# Patient Record
Sex: Female | Born: 1970 | Race: White | Hispanic: No | Marital: Married | Smoking: Former smoker
Health system: Southern US, Community
[De-identification: ages and names within clinical notes are randomized; demographics above are authoritative.]

## PROBLEM LIST (undated history)

## (undated) DIAGNOSIS — F419 Anxiety disorder, unspecified: Secondary | ICD-10-CM

## (undated) DIAGNOSIS — R945 Abnormal results of liver function studies: Secondary | ICD-10-CM

## (undated) DIAGNOSIS — D649 Anemia, unspecified: Secondary | ICD-10-CM

## (undated) DIAGNOSIS — R7989 Other specified abnormal findings of blood chemistry: Secondary | ICD-10-CM

## (undated) HISTORY — PX: ERCP: SHX60

## (undated) HISTORY — DX: Other specified abnormal findings of blood chemistry: R79.89

## (undated) HISTORY — PX: OVARIAN CYST REMOVAL: SHX89

## (undated) HISTORY — PX: ECTOPIC PREGNANCY SURGERY: SHX613

## (undated) HISTORY — PX: CHOLECYSTECTOMY: SHX55

## (undated) HISTORY — DX: Anemia, unspecified: D64.9

## (undated) HISTORY — DX: Abnormal results of liver function studies: R94.5

---

## 1999-12-07 ENCOUNTER — Other Ambulatory Visit: Admission: RE | Admit: 1999-12-07 | Discharge: 1999-12-07 | Payer: Self-pay | Admitting: *Deleted

## 2000-05-02 ENCOUNTER — Inpatient Hospital Stay (HOSPITAL_COMMUNITY): Admission: EM | Admit: 2000-05-02 | Discharge: 2000-05-04 | Payer: Self-pay | Admitting: Emergency Medicine

## 2000-05-02 ENCOUNTER — Encounter (INDEPENDENT_AMBULATORY_CARE_PROVIDER_SITE_OTHER): Payer: Self-pay | Admitting: Specialist

## 2000-05-02 ENCOUNTER — Encounter: Payer: Self-pay | Admitting: Emergency Medicine

## 2002-04-11 ENCOUNTER — Other Ambulatory Visit: Admission: RE | Admit: 2002-04-11 | Discharge: 2002-04-11 | Payer: Self-pay | Admitting: Family Medicine

## 2002-04-15 ENCOUNTER — Encounter: Admission: RE | Admit: 2002-04-15 | Discharge: 2002-04-15 | Payer: Self-pay | Admitting: Family Medicine

## 2002-04-15 ENCOUNTER — Encounter: Payer: Self-pay | Admitting: Family Medicine

## 2004-04-25 ENCOUNTER — Ambulatory Visit: Payer: Self-pay | Admitting: Internal Medicine

## 2004-08-15 ENCOUNTER — Ambulatory Visit: Payer: Self-pay | Admitting: Internal Medicine

## 2004-09-09 ENCOUNTER — Ambulatory Visit: Payer: Self-pay | Admitting: Internal Medicine

## 2004-11-25 ENCOUNTER — Ambulatory Visit: Payer: Self-pay | Admitting: Internal Medicine

## 2004-12-13 ENCOUNTER — Other Ambulatory Visit: Admission: RE | Admit: 2004-12-13 | Discharge: 2004-12-13 | Payer: Self-pay | Admitting: Internal Medicine

## 2004-12-13 ENCOUNTER — Ambulatory Visit: Payer: Self-pay | Admitting: Internal Medicine

## 2005-01-03 ENCOUNTER — Encounter: Admission: RE | Admit: 2005-01-03 | Discharge: 2005-01-03 | Payer: Self-pay | Admitting: Internal Medicine

## 2005-12-19 ENCOUNTER — Ambulatory Visit: Payer: Self-pay | Admitting: Internal Medicine

## 2006-01-02 ENCOUNTER — Ambulatory Visit: Payer: Self-pay | Admitting: Internal Medicine

## 2006-01-08 ENCOUNTER — Ambulatory Visit: Payer: Self-pay | Admitting: Internal Medicine

## 2006-01-08 LAB — CONVERTED CEMR LAB
BUN: 8 mg/dL
CO2: 24 meq/L
Calcium: 9.4 mg/dL
Chloride: 109 meq/L
Creatinine, Ser: 0.8 mg/dL
GFR calc non Af Amer: 86 mL/min
Glomerular Filtration Rate, Af Am: 104 mL/min/1.73m2
Glucose, Bld: 92 mg/dL
HCT: 36.8 %
Hemoglobin: 12.4 g/dL
MCHC: 33.8 g/dL
MCV: 85.7 fL
Platelets: 297 K/uL
Potassium: 3.8 meq/L
RBC: 4.29 M/uL
RDW: 13.3 %
Sed Rate: 11 mm/h
Sodium: 140 meq/L
TSH: 4.46 u[IU]/mL
Total CK: 81 U/L
WBC: 6.9 10*3/microliter

## 2006-01-16 ENCOUNTER — Ambulatory Visit: Payer: Self-pay | Admitting: Cardiovascular Disease

## 2006-08-03 DIAGNOSIS — F419 Anxiety disorder, unspecified: Secondary | ICD-10-CM

## 2006-08-10 ENCOUNTER — Ambulatory Visit: Payer: Self-pay | Admitting: Internal Medicine

## 2006-08-13 LAB — CONVERTED CEMR LAB
ALT: 8 U/L
AST: 17 U/L
Albumin: 3.8 g/dL
Alkaline Phosphatase: 35 U/L — ABNORMAL LOW
Basophils Absolute: 0 10*3/uL
Basophils Relative: 0.5 %
Bilirubin, Direct: 0.2 mg/dL
Eosinophils Absolute: 0.1 10*3/uL
Eosinophils Relative: 0.8 %
HCT: 35.8 % — ABNORMAL LOW
Hemoglobin: 12.3 g/dL
INR: 1
Lymphocytes Relative: 33.6 %
MCHC: 34.2 g/dL
MCV: 88.5 fL
Monocytes Absolute: 0.6 10*3/uL
Monocytes Relative: 8.1 %
Neutro Abs: 4.3 10*3/uL
Neutrophils Relative %: 57 %
Platelets: 237 10*3/uL
Prothrombin Time: 12.3 s
RBC: 4.05 M/uL
RDW: 12.5 %
Total Bilirubin: 1.9 mg/dL — ABNORMAL HIGH
Total Protein: 6.8 g/dL
WBC: 7.6 10*3/uL
aPTT: 24.4 s — ABNORMAL LOW

## 2006-08-15 ENCOUNTER — Telehealth (INDEPENDENT_AMBULATORY_CARE_PROVIDER_SITE_OTHER): Payer: Self-pay | Admitting: *Deleted

## 2006-12-17 ENCOUNTER — Telehealth (INDEPENDENT_AMBULATORY_CARE_PROVIDER_SITE_OTHER): Payer: Self-pay | Admitting: *Deleted

## 2006-12-19 ENCOUNTER — Ambulatory Visit: Payer: Self-pay | Admitting: Internal Medicine

## 2007-01-21 ENCOUNTER — Telehealth (INDEPENDENT_AMBULATORY_CARE_PROVIDER_SITE_OTHER): Payer: Self-pay | Admitting: *Deleted

## 2007-01-28 ENCOUNTER — Telehealth (INDEPENDENT_AMBULATORY_CARE_PROVIDER_SITE_OTHER): Payer: Self-pay | Admitting: *Deleted

## 2007-01-28 ENCOUNTER — Ambulatory Visit: Payer: Self-pay | Admitting: Internal Medicine

## 2007-01-28 LAB — CONVERTED CEMR LAB
Glucose, Urine, Semiquant: NEGATIVE
Ketones, urine, test strip: NEGATIVE
Nitrite: NEGATIVE

## 2007-01-29 ENCOUNTER — Ambulatory Visit: Payer: Self-pay | Admitting: Cardiology

## 2007-01-29 ENCOUNTER — Ambulatory Visit: Payer: Self-pay | Admitting: Internal Medicine

## 2007-01-29 ENCOUNTER — Encounter: Payer: Self-pay | Admitting: Family Medicine

## 2007-01-29 LAB — CONVERTED CEMR LAB: Chlamydia, DNA Probe: NEGATIVE

## 2007-01-30 ENCOUNTER — Telehealth (INDEPENDENT_AMBULATORY_CARE_PROVIDER_SITE_OTHER): Payer: Self-pay | Admitting: *Deleted

## 2007-01-30 ENCOUNTER — Encounter: Payer: Self-pay | Admitting: Internal Medicine

## 2007-01-30 LAB — CONVERTED CEMR LAB
BUN: 8 mg/dL (ref 6–23)
Basophils Absolute: 0 10*3/uL (ref 0.0–0.1)
Basophils Relative: 0.5 % (ref 0.0–1.0)
Creatinine, Ser: 0.8 mg/dL (ref 0.4–1.2)
Eosinophils Relative: 0.9 % (ref 0.0–5.0)
GFR calc non Af Amer: 86 mL/min
Glucose, Bld: 93 mg/dL (ref 70–99)
Hemoglobin: 11.9 g/dL — ABNORMAL LOW (ref 12.0–15.0)
Lymphocytes Relative: 34.5 % (ref 12.0–46.0)
MCHC: 34.6 g/dL (ref 30.0–36.0)
Neutro Abs: 4 10*3/uL (ref 1.4–7.7)
Neutrophils Relative %: 56 % (ref 43.0–77.0)
Platelets: 221 10*3/uL (ref 150–400)
Potassium: 3.9 meq/L (ref 3.5–5.1)
Sodium: 138 meq/L (ref 135–145)
WBC: 7.1 10*3/uL (ref 4.5–10.5)

## 2007-02-05 ENCOUNTER — Telehealth: Payer: Self-pay | Admitting: Internal Medicine

## 2007-02-08 ENCOUNTER — Telehealth: Payer: Self-pay | Admitting: Internal Medicine

## 2007-02-27 ENCOUNTER — Ambulatory Visit: Payer: Self-pay | Admitting: Internal Medicine

## 2007-03-01 ENCOUNTER — Telehealth (INDEPENDENT_AMBULATORY_CARE_PROVIDER_SITE_OTHER): Payer: Self-pay | Admitting: *Deleted

## 2007-03-05 ENCOUNTER — Telehealth (INDEPENDENT_AMBULATORY_CARE_PROVIDER_SITE_OTHER): Payer: Self-pay | Admitting: *Deleted

## 2007-03-06 ENCOUNTER — Ambulatory Visit: Payer: Self-pay | Admitting: Internal Medicine

## 2007-03-07 ENCOUNTER — Telehealth (INDEPENDENT_AMBULATORY_CARE_PROVIDER_SITE_OTHER): Payer: Self-pay | Admitting: *Deleted

## 2007-03-08 ENCOUNTER — Encounter: Payer: Self-pay | Admitting: Internal Medicine

## 2007-03-08 ENCOUNTER — Telehealth (INDEPENDENT_AMBULATORY_CARE_PROVIDER_SITE_OTHER): Payer: Self-pay | Admitting: *Deleted

## 2007-03-14 ENCOUNTER — Encounter: Payer: Self-pay | Admitting: Internal Medicine

## 2007-03-19 ENCOUNTER — Encounter (INDEPENDENT_AMBULATORY_CARE_PROVIDER_SITE_OTHER): Payer: Self-pay | Admitting: *Deleted

## 2007-03-25 ENCOUNTER — Ambulatory Visit: Payer: Self-pay | Admitting: Gastroenterology

## 2007-09-09 ENCOUNTER — Telehealth (INDEPENDENT_AMBULATORY_CARE_PROVIDER_SITE_OTHER): Payer: Self-pay | Admitting: *Deleted

## 2008-01-27 ENCOUNTER — Telehealth (INDEPENDENT_AMBULATORY_CARE_PROVIDER_SITE_OTHER): Payer: Self-pay | Admitting: *Deleted

## 2008-03-13 ENCOUNTER — Ambulatory Visit: Payer: Self-pay | Admitting: Family Medicine

## 2008-11-10 ENCOUNTER — Telehealth (INDEPENDENT_AMBULATORY_CARE_PROVIDER_SITE_OTHER): Payer: Self-pay | Admitting: *Deleted

## 2009-03-04 ENCOUNTER — Encounter: Payer: Self-pay | Admitting: Internal Medicine

## 2009-03-06 ENCOUNTER — Encounter: Payer: Self-pay | Admitting: Internal Medicine

## 2009-03-08 ENCOUNTER — Encounter: Payer: Self-pay | Admitting: Internal Medicine

## 2009-03-10 ENCOUNTER — Encounter: Payer: Self-pay | Admitting: Internal Medicine

## 2009-03-12 ENCOUNTER — Telehealth (INDEPENDENT_AMBULATORY_CARE_PROVIDER_SITE_OTHER): Payer: Self-pay | Admitting: *Deleted

## 2009-03-15 ENCOUNTER — Ambulatory Visit: Payer: Self-pay | Admitting: Internal Medicine

## 2009-03-15 ENCOUNTER — Ambulatory Visit (HOSPITAL_BASED_OUTPATIENT_CLINIC_OR_DEPARTMENT_OTHER)
Admission: RE | Admit: 2009-03-15 | Discharge: 2009-03-15 | Payer: Self-pay | Source: Home / Self Care | Admitting: Internal Medicine

## 2009-03-15 ENCOUNTER — Ambulatory Visit: Payer: Self-pay | Admitting: Diagnostic Radiology

## 2009-03-15 DIAGNOSIS — K219 Gastro-esophageal reflux disease without esophagitis: Secondary | ICD-10-CM

## 2009-03-15 DIAGNOSIS — D509 Iron deficiency anemia, unspecified: Secondary | ICD-10-CM | POA: Insufficient documentation

## 2009-03-16 DIAGNOSIS — R911 Solitary pulmonary nodule: Secondary | ICD-10-CM

## 2009-03-18 ENCOUNTER — Encounter: Payer: Self-pay | Admitting: Internal Medicine

## 2009-03-19 ENCOUNTER — Encounter: Payer: Self-pay | Admitting: Internal Medicine

## 2009-03-20 ENCOUNTER — Encounter: Payer: Self-pay | Admitting: Internal Medicine

## 2009-03-22 ENCOUNTER — Encounter: Payer: Self-pay | Admitting: Internal Medicine

## 2009-03-23 ENCOUNTER — Encounter: Payer: Self-pay | Admitting: Internal Medicine

## 2009-03-24 ENCOUNTER — Encounter: Payer: Self-pay | Admitting: Internal Medicine

## 2009-04-06 ENCOUNTER — Encounter: Payer: Self-pay | Admitting: Internal Medicine

## 2009-04-07 ENCOUNTER — Telehealth (INDEPENDENT_AMBULATORY_CARE_PROVIDER_SITE_OTHER): Payer: Self-pay | Admitting: *Deleted

## 2009-04-14 ENCOUNTER — Telehealth: Payer: Self-pay | Admitting: Internal Medicine

## 2009-04-19 ENCOUNTER — Ambulatory Visit: Payer: Self-pay | Admitting: Internal Medicine

## 2009-04-19 DIAGNOSIS — Z8639 Personal history of other endocrine, nutritional and metabolic disease: Secondary | ICD-10-CM

## 2009-04-19 DIAGNOSIS — Z862 Personal history of diseases of the blood and blood-forming organs and certain disorders involving the immune mechanism: Secondary | ICD-10-CM

## 2009-05-04 ENCOUNTER — Telehealth (INDEPENDENT_AMBULATORY_CARE_PROVIDER_SITE_OTHER): Payer: Self-pay | Admitting: *Deleted

## 2009-05-06 ENCOUNTER — Telehealth (INDEPENDENT_AMBULATORY_CARE_PROVIDER_SITE_OTHER): Payer: Self-pay | Admitting: *Deleted

## 2009-05-10 LAB — CONVERTED CEMR LAB
AST: 26 units/L (ref 0–37)
Alkaline Phosphatase: 42 units/L (ref 39–117)
Basophils Relative: 2.1 % (ref 0.0–3.0)
Bilirubin, Direct: 0.2 mg/dL (ref 0.0–0.3)
Eosinophils Relative: 1.5 % (ref 0.0–5.0)
Folate: 14.7 ng/mL
HCT: 31.9 % — ABNORMAL LOW (ref 36.0–46.0)
Iron: 52 ug/dL (ref 42–145)
Lymphs Abs: 3.3 10*3/uL (ref 0.7–4.0)
MCHC: 31.6 g/dL (ref 30.0–36.0)
MCV: 83.9 fL (ref 78.0–100.0)
Neutro Abs: 2.3 10*3/uL (ref 1.4–7.7)
Neutrophils Relative %: 33.3 % — ABNORMAL LOW (ref 43.0–77.0)
Total Protein: 7.1 g/dL (ref 6.0–8.3)
Vitamin B-12: 474 pg/mL (ref 211–911)
WBC: 7 10*3/uL (ref 4.5–10.5)

## 2009-06-08 ENCOUNTER — Encounter (INDEPENDENT_AMBULATORY_CARE_PROVIDER_SITE_OTHER): Payer: Self-pay | Admitting: *Deleted

## 2009-07-09 ENCOUNTER — Telehealth (INDEPENDENT_AMBULATORY_CARE_PROVIDER_SITE_OTHER): Payer: Self-pay | Admitting: *Deleted

## 2009-08-09 ENCOUNTER — Ambulatory Visit: Payer: Self-pay | Admitting: Internal Medicine

## 2010-02-25 ENCOUNTER — Ambulatory Visit
Admission: RE | Admit: 2010-02-25 | Discharge: 2010-02-25 | Payer: Self-pay | Source: Home / Self Care | Attending: Internal Medicine | Admitting: Internal Medicine

## 2010-03-07 ENCOUNTER — Ambulatory Visit
Admission: RE | Admit: 2010-03-07 | Discharge: 2010-03-07 | Payer: Self-pay | Source: Home / Self Care | Attending: Internal Medicine | Admitting: Internal Medicine

## 2010-03-14 ENCOUNTER — Ambulatory Visit (HOSPITAL_COMMUNITY)
Admission: RE | Admit: 2010-03-14 | Discharge: 2010-03-14 | Payer: Self-pay | Source: Home / Self Care | Attending: Internal Medicine | Admitting: Internal Medicine

## 2010-03-25 ENCOUNTER — Ambulatory Visit (HOSPITAL_COMMUNITY)
Admission: RE | Admit: 2010-03-25 | Discharge: 2010-03-25 | Payer: Self-pay | Source: Home / Self Care | Attending: Internal Medicine | Admitting: Internal Medicine

## 2010-03-27 LAB — CONVERTED CEMR LAB
ALT: 11 U/L
AST: 22 U/L
AST: 293 units/L
Albumin: 4 g/dL
Alkaline Phosphatase: 39 U/L
Alkaline Phosphatase: 89 units/L
B-12, serum: 504 pg/mL
B-12, serum: 504 pg/mL
Brain Natriuretic Peptide: 13.8
Ferritin: 6 ng/mL
Folate: 18.4 ng/mL
Hemoglobin: 10.5 g/dL
Iron: 14 ug/dL
Total Bilirubin: 0.7 mg/dL
Total Bilirubin: 2.2 mg/dL
Total Protein: 6.1 g/dL
Total Protein: 6.8 g/dL
Vit D, 25-Hydroxy: 19 ng/mL
WBC, blood: 6.1 10*3/mm3
platelet count: 271 10*3/microliter

## 2010-03-31 NOTE — Miscellaneous (Signed)
Summary: labs from [facility] laboratory  Clinical Lists Changes  Observations: Added new observation of PROTEIN, TOT: 6.1 g/dL (72/53/6644 0:34) Added new observation of ALBUMIN: 3.5 g/dL (74/25/9563 8:75) Added new observation of BILI TOTAL: 2.2 mg/dL (64/33/2951 8:84) Added new observation of ALK PHOS: 89 units/L (03/20/2009 8:25) Added new observation of SGPT (ALT): 213 units/L (03/20/2009 8:25) Added new observation of SGOT (AST): 293 units/L (03/20/2009 8:25)        Chemistry Labs Test Date: 03/20/2009                      Value Units        H/L   Reference  SGOT:               293   U/L           H    (10-40) SGPT:               213   U/L           H    (10-40) Alkaline P'tase:    89    U/L                (10-120) T. Bili:            2.2   mg/dL         H    (1.6-6.0) Albumin:            3.5   g/dL               (3-5) Total Protein:      6.1   g/dL               (4-7)  Lab name:           [facility] Test ordered by:    Lionel December, md

## 2010-03-31 NOTE — Progress Notes (Signed)
Summary: sick  Phone Note Call from Patient Call back at [facility] Phone [phone]   Summary of Call:  - cough, fever, achy, requesting abx.  Advised pt needs ov.  Patient states she will go to urgent care Donna Lucas  April 07, 2009 10:43 AM

## 2010-03-31 NOTE — Progress Notes (Signed)
  Phone Note Call from Patient Call back at [facility] Phone [phone]   Summary of Call: Patient is still having back pain, stomach pain.  She is states that she feels "chilly" all the time & tired.  She is requesting labs to check iron levels  & anything else that you might think she needs.  She would like to come in next monday for labs & then schedule a follow up visit after that.  Suggestions??  labs??   Initial call taken by: Shary Decamp,  April 14, 2009 11:03 AM  Follow-up for Phone Call         OV if so desire,  labs at time of the visit.  Follow-up by: Nolon Rod. Amatullah Christy MD,  April 14, 2009 11:25 AM  Additional Follow-up for Phone Call Additional follow up Details #1::        scheduled ov Additional Follow-up by: Shary Decamp,  April 14, 2009 11:29 AM

## 2010-03-31 NOTE — Miscellaneous (Signed)
Summary: labs from urgent care   original scanned into emr  D-dimer 0.41 ug/ml Troponin 1 <0.01 ng/ml  Donna Lucas  March 17, 2009 8:18 AM  Clinical Lists Changes  Observations: Added new observation of VIT D 25-OH: 19 ng/mL (03/10/2009 8:10) Added new observation of IRON: 14 mcg/dL (04/54/0981 1:91) Added new observation of BNP: 13.8  (03/06/2009 10:11) Added new observation of FERRITIN: 6 ng/mL (03/06/2009 8:17) Added new observation of FOLATE: 18.4 ng/mL (03/06/2009 8:17) Added new observation of B-12: 504 pg/mL (03/06/2009 8:17) Added new observation of B-12: 504 pg/mL (03/06/2009 8:17) Added new observation of PLATELET CNT: 271 10*3/microliter (03/04/2009 8:12) Added new observation of HGB: 10.5 g/dL (47/82/9562 1:30) Added new observation of WBC: 6.1 10*3/mm3 (03/04/2009 8:12)     Iron, serum:          14           ug/dl         L    (86-578)                       4            %                     Vit D. 25-OH:         19           ng/ml              (16-74)  Test ordered by:      [facility]na. [facility] urgent care    Complete Blood Count Test Date: 03/04/2009             Value   Units      H/L    Reference  WBC:       6.1   X 10^3/uL          (3.5-10.0) Hgb:       10.5  g/dl          L    (46.9-62.3) Platelets: 271   X 10^3/uL          (150-450)  Test performed by:  [facility]    Lab Entry Test Date: 01/08/201101/12/201101/01/2010                        Value        Units        H/L   Reference  B-12 level:           504          pg/ml              [phone]) Ferritin:             6            ng/ml         L    (22-322) Folate (Ser):         18.4         ng/ml              (2.7-20)      Lipid Panel Test Date: 03/06/2009                        Value        Units        H/L   Reference  BNP  13.8                                

## 2010-03-31 NOTE — Letter (Signed)
Summary: [facility]  [facility]   Imported By: Lanelle Bal 03/19/2009 09:51:41  _____________________________________________________________________  External Attachment:    Type:   Image     Comment:   External Document

## 2010-03-31 NOTE — Consult Note (Signed)
Summary: Marylene Buerger MD  Cherie Ouch MD   Imported By: Lanelle Bal 04/21/2009 10:56:14  _____________________________________________________________________  External Attachment:    Type:   Image     Comment:   External Document

## 2010-03-31 NOTE — Assessment & Plan Note (Signed)
Summary: followup/alr   Vital Signs:  Patient profile:   40 year old female Height:      64 inches Weight:      138 pounds Temp:     98.8 degrees F oral Pulse rate:   80 / minute BP sitting:   90 / 78  (left arm)  Vitals Entered By: Jeremy Johann CMA (August 09, 2009 2:56 PM) CC: follow-up med Comments --refill REVIEWED MED LIST, PATIENT AGREED DOSE AND INSTRUCTION CORRECT    History of Present Illness: here for followup She had severe anxiety which was chronic, at the time of the last visit we increased her citalopram dose She has also talked with the school psychologist who gave her good advice She has quit her second job, enjoying a less stressful life Doing great   Allergies (verified): No Known Drug Allergies  Past History:  Past Medical History: Reviewed history from 04/19/2009 and no changes required. BC--condoms Anxiety elevated LFTs after a cholecystectomy 1-11, status post ERCP essentially okay diagnosed with anemia 1-11, saw  GI abnormal CT of the chest 1-11, solitary nodule, follow-up by pulmonology, neck CT, April 2011  Past Surgical History: Reviewed history from 03/15/2009 and no changes required. 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 All symptoms resolve after anxiety was taken care of.   Physical Exam  General:  alert and well-developed.   Lungs:  normal respiratory effort, no intercostal retractions, no accessory muscle use, and normal breath sounds.   Heart:  normal rate, regular rhythm, no murmur, and no gallop.   Extremities:  no edema Psych:  Oriented X3, memory intact for recent and remote, normally  interactive, good eye contact, not anxious appearing, and not depressed appearing.     Impression & Recommendations:  Problem # 1:  ANXIETY (ICD-300.00) doing great with Celexa She has some side effects (gained 4 pounds, slightly sleepy) but we agreed to continue with this medicine for now since the side effects are  not severe. Reassess in 6 months Anticipate she will need citalopram for a while since she has chronic anxiety The following medications were removed from the medication list:    Alprazolam 0.5 Mg Tabs (Alprazolam) .Marland Kitchen... 1 by mouth three times a day Her updated medication list for this problem includes:    Celexa 40 Mg Tabs (Citalopram hydrobromide) .Marland Kitchen... 1 by mouth once daily - needs office visit for additional refills  Complete Medication List: 1)  Celexa 40 Mg Tabs (Citalopram hydrobromide) .Marland Kitchen.. 1 by mouth once daily - needs office visit for additional refills  Patient Instructions: 1)  Please schedule a follow-up appointment in 6 months .  Prescriptions: CELEXA 40 MG  TABS (CITALOPRAM HYDROBROMIDE) 1 by mouth once daily - NEEDS OFFICE VISIT FOR ADDITIONAL REFILLS  #90 x 2   Entered and Authorized by:   Nolon Rod. Gentry Seeber MD   Signed by:   Nolon Rod. Tahj Lindseth MD on 08/09/2009   Method used:   Electronically to        [facility]* (retail)       [address]       [place], [place]  [address]       Ph: [phone]  Fax: [phone]   RxID:   [id number]

## 2010-03-31 NOTE — Miscellaneous (Signed)
Summary: Orders Update  Clinical Lists Changes  Orders: Added new Referral order of Pulmonary Referral (Pulmonary) - Signed 

## 2010-03-31 NOTE — Progress Notes (Signed)
Summary: Refill Request Alprazolam  Phone Note Refill Request Message from:  [facility] [address] Fax #: [phone]  Refills Requested: Medication #1:  ALPRAZOLAM 0.5 MG TABS 1 by mouth three times a day   Dosage confirmed as above?Dosage Confirmed   Supply Requested: 1 month   Last Refilled: 03/19/2009 last refill #60 x 0 on 03/19/09, last ov 04/19/09  Next Appointment Scheduled: 3.15.11 Initial call taken by: Harold Barban,  May 06, 2009 9:32 AM  Follow-up for Phone Call        ok 60, no RF Follow-up by: Nolon Rod. Paz MD,  May 06, 2009 12:59 PM    Prescriptions: ALPRAZOLAM 0.5 MG TABS (ALPRAZOLAM) 1 by mouth three times a day  #60 x 0   Entered by:   Kandice Hams   Authorized by:   Nolon Rod. Paz MD   Signed by:   Kandice Hams on 05/06/2009   Method used:   Printed then faxed [facility] ...       [facility]  [address] [phone] (retail)       [address]       [place], [place]  [address]       Ph: [phone]       Fax: [phone]   RxID:   [id number]

## 2010-03-31 NOTE — Progress Notes (Signed)
  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 Office visit scheduled for June 6  rx flled for 30 days .Kandice Hams  Jul 09, 2009 9:05 AM  Initial call taken by: Kandice Hams,  Jul 09, 2009 9:05 AM    Prescriptions: CELEXA 40 MG  TABS (CITALOPRAM HYDROBROMIDE) 1 by mouth once daily - NEEDS OFFICE VISIT FOR ADDITIONAL REFILLS  #30 x 0   Entered by:   Kandice Hams   Authorized by:   Nolon Rod. Paz MD   Signed by:   Kandice Hams on 07/09/2009   Method used:   Faxed to ...       [facility]* (retail)       [address]       [place], [place]  [address]       Ph: [phone]       Fax: [phone]   RxID:   [id number]

## 2010-03-31 NOTE — Letter (Signed)
Summary: Primary Care Appointment Letter  [facility] [place]  [address] [place], [place] [address]   Phone: [phone]  Fax: [phone]    06/08/2009 MRN: [id number]  Donna Lucas [address] [place], [place]  [address]  Dear Ms. Lucas,   Your Primary Care Physician Neena Rhymes MD has indicated that:    ___x____it is time to schedule an appointment.  Please call our office @ [phone] to schedule an office visit with Dr. Drue Novel.       Thank you,     Primary Care Scheduler

## 2010-03-31 NOTE — Progress Notes (Signed)
  Phone Note Call from Patient   Caller: Patient Summary of Call: Pt called lives in [place] and has been going to an urgent care there, had lab work and xrays of her chest, does not quite feel comfortable with them, wants to see Dr Drue Novel to look over labs and also needs refill of Celexa. Pt will bring copy of labwork Initial call taken by: Kandice Hams,  March 12, 2009 12:31 PM

## 2010-03-31 NOTE — Op Note (Signed)
Summary: Cholangiopancreatography/[facility]   Imported By: Lanelle Bal 04/06/2009 14:00:04  _____________________________________________________________________  External Attachment:    Type:   Image     Comment:   External Document

## 2010-03-31 NOTE — Progress Notes (Signed)
Summary: left msg for pt to call  Phone Note Call from Patient   Caller: Patient Summary of Call: pt called left msg on VM has questions what to do next?   has questions about Xanax.   Called pt back got VM, left msg for pt to call for clarification .Kandice Hams  May 04, 2009 2:34 PM  Initial call taken by: Kandice Hams,  May 04, 2009 2:34 PM  Follow-up for Phone Call        spoke with patient who seems to have multiple issues; c/o spine tenderness and back pain, stomach still doing flip flops does not think it is anxiety; taking her Celexa and xanax and feeling much better. OV scheduled .Kandice Hams  May 04, 2009 3:23 PM  Follow-up by: Kandice Hams,  May 04, 2009 3:23 PM

## 2010-03-31 NOTE — Assessment & Plan Note (Signed)
Summary: discuss lab work/swh   Vital Signs:  Patient profile:   40 year old female Height:      64 inches Weight:      133.2 pounds O2 Sat:      97 % Pulse rate:   98 / minute BP sitting:   110 / 60  Vitals Entered By: Shary Decamp (April 19, 2009 4:24 PM) CC: pt is convinced that she has leukemia, lymphoma or some type of cancer.  States she has constant bone tenderness, fever, chills, sweat.  Patient did take tamiflu last week for flu-like sxs (rx'd by GI MD)   History of Present Illness: "I know I have  leukemia "  the patient has a number of symptoms on and off: constant bone tenderness, fever, chills, sweat. also weak and shaky still has epigastric pain and occasional cough Patient did take tamiflu last week for flu-like sxs (rx'd by GI MD)  recently, she has been through a lot: She had her gallbladder out she was found to have anemia and elevated LFTs, he had ERCP, reported reviewed, it was okay, the left a stent prophylactically. LFTs were back to normal she also had some abnormal CT of the chext  1 -- 11, status post eval by  a pulmonologist in another town, she was told she was okay, next follow up April 2011    Current Medications (verified): 1)  Celexa 40 Mg  Tabs (Citalopram Hydrobromide) .Marland Kitchen.. 1 By Mouth Once Daily 2)  Alprazolam 0.5 Mg Tabs (Alprazolam) .Marland Kitchen.. 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  distressed, tearful, anxious   Impression & Recommendations:  Problem # 1:  ANXIETY (ICD-300.00) I think this is the main problem  she has she agrees with me increase Celexa from 20 to 40 mg ( she was only taking 20 mg up to today) needs to see a counselor  psych referral? , she has some OCD features she has a number of other  symptoms: epigastric tenderness, bone aches,  weak ----> reassess after  anxiety treatment Her updated medication list for this problem includes:    Celexa 40 Mg Tabs (Citalopram hydrobromide) .Marland Kitchen... 1 by mouth once daily    Alprazolam 0.5 Mg Tabs (Alprazolam) .Marland Kitchen... 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 1 by mouth once daily 2)  Alprazolam 0.5 Mg  Tabs (Alprazolam) .Marland Kitchen.. 1 by mouth three times a day  Other Orders: TLB-Hepatic/Liver Function Pnl (80076-HEPATIC)  Patient Instructions: 1)  takes Celexa 40 mg every day 2)  Please schedule a follow-up appointment in 1 month.  3)  you need to see a counselor

## 2010-03-31 NOTE — Assessment & Plan Note (Signed)
Summary: stomach issues/kn   Vital Signs:  Patient profile:   40 year old female Height:      64 inches Weight:      140.38 pounds BMI:     24.18 Pulse rate:   86 / minute Pulse rhythm:   regular BP sitting:   122 / 80  (left arm) Cuff size:   regular  Vitals Entered By: Army Fossa CMA (February 25, 2010 8:58 AM) CC: Pt here having pain in upper abdomen- when she gets the pain also gets a cough. Comments x 2 weeks Seeing her GI doc soon HAd galbladder removed last year Fasting Print rx to give to her.    History of Present Illness: 2 weeks history of cough The cough started after some GI symptoms. See review of systems  anxiety-- she is self decrease citalopram to 20 mg daily and doing very well  Current Medications (verified): 1)  Celexa 40 Mg  Tabs (Citalopram Hydrobromide) .... 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 ENT:  no sinus congestion. Resp:  Denies sputum productive and wheezing. GI:  2 weeks ago as an exacerbation of her own and no GI symptoms: Episodic right upper quadrant pain, heartburn. Overall, GI symptoms are slightly better.plans to see her GI doctor in 2 weeks.  Physical Exam  General:  alert, well-developed, and well-nourished.   Ears:  R ear normal and L ear normal.   Mouth:  good dentition.  no redness or discharge Lungs:  normal respiratory effort, no intercostal retractions, no accessory muscle use, and normal breath sounds.   Heart:  normal rate, regular rhythm, no murmur,  and no gallop.   Abdomen:  soft, non-tender, no distention, no masses, no guarding, and no rigidity.     Impression & Recommendations:  Problem # 1:  COUGH (ICD-786.2) dry cough without evidence of bronchitis or sinusitis Related to GERD? Start PPIs Patient will call if not better  Problem # 2:  GERD (ICD-530.81) some acid reflux. See instructions  Problem # 3:  ANXIETY (ICD-300.00) patient's decreased citalopram from 40-----> 20. Doing very well. We agreed that she will continue to taper down to 10 mg daily and possibly discontinue the medicines Her updated medication list for this problem includes:    Citalopram Hydrobromide 20 Mg Tabs (Citalopram hydrobromide) .Marland Kitchen... 1 by mouth once daily  Complete Medication List: 1)  Citalopram Hydrobromide 20 Mg Tabs (Citalopram hydrobromide) .Marland Kitchen.. 1 by mouth once daily  Patient Instructions: 1)  take prevacid 1 tablet before breakfast x 2 weeks  2)  call if no better    Orders Added: 1)  Est. Patient Level III [[address]]

## 2010-03-31 NOTE — Miscellaneous (Signed)
Summary: CT results  Discussed with Dr. Drue Novel -- pulm referral, discussed results with husband, pt very anxious.  Advised to have pt contact me with ? or concerns.  Husband request refill on xanax.  Clinical Lists Changes  Medications: Added new medication of ALPRAZOLAM 0.5 MG TABS (ALPRAZOLAM) 1 by mouth three times a day - Signed Rx of ALPRAZOLAM 0.5 MG TABS (ALPRAZOLAM) 1 by mouth three times a day;  #60 x 0;  Signed;  Entered by: Shary Decamp;  Authorized by: Nolon Rod Catalea Labrecque MD;  Method used: [facility]  [address] [id number] [address] [place], [place]  [address], Ph: [phone], Fax: [phone] Observations: Added new observation of CT OF CHEST:   abnormal:   (03/18/2009 17:08)    Prescriptions: ALPRAZOLAM 0.5 MG TABS (ALPRAZOLAM) 1 by mouth three times a day  #60 x 0   Entered by:   Shary Decamp   Authorized by:   Nolon Rod. Gertrude Tarbet MD   Signed by:   Shary Decamp on 03/19/2009   Method used:   Printed then [facility]ed [facility] ...       [facility]  [address] [phone][facility] (retail)       [address]       [place], [place]  [address]       Ph: [phone]       Fax: [phone]   RxID:   [id number]    CT of Chest  Procedure date:  03/18/2009  Findings:      abnormal:    CT of Chest  Procedure date:  03/18/2009  Findings:        abnormal:    Comments:      IMPRESSION: 1)  consistent with hx in the left upper lobe there is a 9x8 mm somewhat irregular nodule with a vague halo of ground-glass attenuation.  Although this may be seen in alveolar proteinosis it is unusual to have only solitary lung nodule.  Malignancy cannot be excluded.  Further evaluation with PED scan and/or biopsy is recommended.   2) No acute infiltrate or edema. No adenopathy 3) Tiny pleural-based nodule in the left lower lobe laterally measure s 4mm.  [facility]  report scanned into emr Shary Decamp  March 19, 2009 5:06 PM     CT of Chest  Procedure date:   03/18/2009  Findings:      abnormal:    CT of Chest  Procedure date:  03/18/2009  Findings:        abnormal:    Comments:      IMPRESSION: 1)  consistent with hx in the left upper lobe there is a 9x8 mm somewhat irregular nodule with a vague halo of ground-glass attenuation.  Although this may be seen in alveolar proteinosis it is unusual to have only solitary lung nodule.  Malignancy cannot be excluded.  Further evaluation with PED scan and/or biopsy is recommended.   2) No acute infiltrate or edema. No adenopathy 3) Tiny pleural-based nodule in the left lower lobe laterally measure s 4mm.  [facility]  report scanned into emr Frost  March 19, 2009 5:06 PM

## 2010-03-31 NOTE — Miscellaneous (Signed)
Summary: labs from [employer], md  Clinical Lists Changes  Observations: Added new observation of PROTEIN, TOT: 6.8 g/dL (16/11/9602 54:09) Added new observation of ALBUMIN: 4.0 g/dL (81/19/1478 29:56) Added new observation of BILI TOTAL: 0.7 mg/dL (21/30/8657 84:69) Added new observation of ALK PHOS: 39 units/L (04/06/2009 16:09) Added new observation of SGPT (ALT): 11 units/L (04/06/2009 16:09) Added new observation of SGOT (AST): 22 units/L (04/06/2009 16:09)        Chemistry Labs Test Date: 04/06/2009                      Value Units        H/L   Reference  SGOT:               22    U/L                (10-40) SGPT:               11    U/L                (10-40) Alkaline P'tase:    39    U/L                (10-120) T. Bili:            0.7   mg/dL              (6.2-9.5) Albumin:            4.0   g/dL               (3-5) Total Protein:      6.8   g/dL               (4-7)

## 2010-03-31 NOTE — Assessment & Plan Note (Signed)
Summary: discuss labwork from urgent care/alr   Vital Signs:  Patient profile:   40 year old female Height:      64 inches Weight:      135 pounds BMI:     23.26 Pulse rate:   72 / minute BP sitting:   112 / 80  Vitals Entered By: Shary Decamp (March 15, 2009 4:07 PM) CC: acute only Comments  - back pain x months Shary Decamp  March 15, 2009 4:15 PM    History of Present Illness: has been seen with several issues but her local M.D. in [place]: -- upper back pain, no relation to the arms, worse when she eats certain foods like crackers --abdomen pain, this was evaluated by a HIDA scan, it was positive.  Status post surgery.  Pain is better -- occ  shortness of breath, after chest x-ray a couple weeks ago she was prescribed a Z-Pak.  Now  afebrile --also heartburn described as chest burning on and off denies dysphagia but does have odynophagia ( exacerbation of the upper back pain with certain foods) -- was found to have iron deficiency anemia, was referred to a local GI in [place], they prescribed PPIs.  No endoscopies so far.  she drove several labs from 03/04/2009 hemoglobin 10.5, d-dimer negative, troponin negative, BNP negative, iron was quite low at 14, vitamin D 19 (low) of  Current Medications (verified): 1)  Celexa 40 Mg  Tabs (Citalopram Hydrobromide) .Marland Kitchen.. 1 By Mouth Once Daily 2)  Dexilant 60 Mg Cpdr (Dexlansoprazole) .Marland Kitchen.. 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   Lungs:  normal respiratory effort, no intercostal retractions, no accessory muscle use, and normal breath sounds.   Heart:  normal rate, regular rhythm, no murmur, and no gallop.   Abdomen:  soft, no distention, no masses, no guarding, no rigidity, and no rebound tenderness.  slightly tender in the epigastric area Msk:  slightly tender at the T spine ( approximately T7) Extremities:  no edema Psych:  Oriented X3, memory intact for recent and remote, normally interactive, good eye contact, and not depressed appearing.  slightly anxious   Impression & Recommendations:  Problem # 1:  here with several issues --GI symptoms: likely GERD related, esophagitis?, PUD? I recommend her to discuss her symptoms further with her GI, in my opinion she needs further workup for anemia -- anemia:  see above -- back pain: interestingly, the pain is worse with certain food.  For completeness will do x-rays however the symptoms may be related to her GI symptoms --SOB : unclear etiology, recent labs normal except for mild anemia.Will repeat a CXR and the observe -- anxiety:  continue with present treatment  Problem # 2:  SHORTNESS OF BREATH (ICD-786.05) see #1 Orders: T-2 View CXR (71020TC)  Problem # 3:  BACK PAIN, THORACIC REGION (ICD-724.1) see #1 Orders: T-Thoracic Spine 2 Views [phone])  Problem # 4:  GERD (ICD-530.81) see #1 The following medications  were removed from the medication list:    Omeprazole 40 Mg Cpdr (Omeprazole) .Marland Kitchen... 1 tab by mouth daily Her updated medication list for this problem includes:    Dexilant 60 Mg Cpdr (Dexlansoprazole) .Marland Kitchen... 1 by mouth once daily  Problem # 5:  ANEMIA, IRON DEFICIENCY (ICD-280.9) see #1  Complete Medication List: 1)  Celexa 40 Mg Tabs (Citalopram hydrobromide) .Marland Kitchen.. 1 by mouth once daily 2)  Dexilant 60 Mg Cpdr (Dexlansoprazole) .Marland Kitchen.. 1 by mouth once daily  Patient Instructions: 1)   Please schedule a follow-up appointment in 2 weeks

## 2010-04-25 ENCOUNTER — Ambulatory Visit (INDEPENDENT_AMBULATORY_CARE_PROVIDER_SITE_OTHER): Payer: Self-pay | Admitting: Internal Medicine

## 2010-05-30 ENCOUNTER — Ambulatory Visit (INDEPENDENT_AMBULATORY_CARE_PROVIDER_SITE_OTHER): Payer: Self-pay | Admitting: Internal Medicine

## 2010-07-15 NOTE — Assessment & Plan Note (Signed)
[facility]                                 ON-CALL NOTE   Donna Lucas, Donna Lucas                         MRN:          [id number]  DATE:02/09/2007                            DOB:          07/18/1970    Patient of Dr. Drue Novel.   The patient calling because she has had abdominal pain for a month, and  nobody has been able to find the cause.  She has had evals including CT  scans of her abdomen.  Her symptoms have not changed today.  She has no  fever, nausea, vomiting, diarrhea, dysuria.  Advised she would need  further diagnostic studies to determine the etiology of her abdominal  pain.  If she would like to come to the hospital, we could try doing  that over the weekend, or if she wanted [place] to wait, she could see Dr.  Drue Novel on Monday for further consultation.  The patient would like to wait  until Monday.  Advised if her pain gets worse, come to the hospital.     Tinnie Gens A. Tawanna Cooler, MD  Electronically Signed    JAT/MedQ  DD: 02/09/2007  DT: 02/11/2007  Job #: [id number]

## 2010-07-15 NOTE — Discharge Summary (Signed)
[facility]  Patient:    Donna Lucas, Donna Lucas                       MRN: [id number] Adm. Date:  [id number] Disc. Date: [id number] Attending:  Lanna Poche                           Discharge Summary  HISTORY OF PRESENT ILLNESS:  The patient is a 40 year old, white, married female, para 3-0-3, admitted in the early a.m. on May 02, 2000, with abdominal pain and a diagnosis of a ruptured right tubal pregnancy was made.  HOSPITAL COURSE:  She was taken to the operating room straightaway, given two units of blood, and a right ruptured tubal pregnancy was removed.  The hematoperitoneum was evacuated.  The patients postoperative course has been uneventful, except for being weak and having a lot of gaseous distention of her bowel.  She is being discharged on postoperative day #2, afebrile, in good condition, still distended abdomen, still some cramping, and an extremely tender abdomen.  Otherwise she is in good condition.  FOLLOW-UP:  She is to return to my office on Monday for re-evaluation for removal of staples.  She will given a postoperative examination date at that time.  She will call me if she has any further trouble.  LABORATORY DATA:  The hemoglobin initially was 8 g postoperatively.  After two units of blood, it was up to 9 with a hematocrit of 25%.  DISCHARGE MEDICATIONS:  She is being discharged on Percodan for pain and Chromagen to replace her blood and increase her hemoglobin.  IMPRESSION:  Ruptured right tubal pregnancy with hematoperitoneum.  OPERATIONS:  Celiotomy, removal of right ruptured tubal pregnancy, and evacuation of hematoperitoneum. DD:  05/04/00 TD:  05/05/00 Job: [id number]

## 2010-07-15 NOTE — Op Note (Signed)
[facility]  Patient:    Donna Lucas, Donna Lucas                       MRN: [id number] Proc. Date: 05/02/00 Attending:  Lanna Poche CC:         Dr. Roberto Scales (2 copies)   Operative Report  PREOPERATIVE DIAGNOSIS:  Right ruptured tubal pregnancy with hematoperitoneum.  OPERATION:  Celiotomy with removal of ruptured ectopic pregnancy, right; partial salpingectomy, evacuation of large hematoperitoneum.  DESCRIPTION OF PROCEDURE:  After satisfactory general anesthesia was obtained, the patient placed on table in lithotomy position.  The perineum and vagina prepped with Betadine solution; catheter was placed in the bladder, the bag inflated and left indwelling.  The lower abdomen prepped for lower transverse incision using Betadine.  An incision was made in the usual fashion, carried through to the peritoneal cavity.  A large amount of old clotted and dark, free blood was encountered.  This was removed with suction and sponges.  The right side of the pelvis was then identified.  The ovary, which was perfectly on the right side, was brought up.  The tube had a large hemorrhagic area and was ruptured.  The ectopic pregnancy was removed using Kelly clamps to clamp off the diseased portion.  The tube and the blood supply were secured with separate sutures.  Hemostasis thereafter was excellent.  The remainder of the blood, which included a lot of clotted blood and was estimated to be in the neighborhood of 1700 cc, was removed.  The large bowel was placed down into the pelvis.  The omentum was brought down over the operative field.  The parietal peritoneum closed with #1 chromic catgut suture.  The muscles were reapproximated with the same suture.  The fascia was closed with 2-0 Vicryl running and locking stitch, subcutaneous stitches of 2-0 plain catgut were used to reestablish the subcutaneous fat, and the skin edges reapproximated using the skin stapling device.   The patient tolerated the procedure well. The blood loss during the procedure was very minimal.  The total blood loss from the ruptured tubal pregnancy was measured and estimated blood at approximately 1700 cc.  Two units of blood was given during surgery.  The patient was sent to the recovery room in good condition with catheter indwelling. DD:  05/02/00 TD:  05/02/00 Job: [id number]

## 2010-08-15 ENCOUNTER — Other Ambulatory Visit: Payer: Self-pay | Admitting: Family Medicine

## 2010-08-25 ENCOUNTER — Other Ambulatory Visit (HOSPITAL_COMMUNITY): Payer: Self-pay | Admitting: General Surgery

## 2010-08-25 DIAGNOSIS — Z139 Encounter for screening, unspecified: Secondary | ICD-10-CM

## 2010-09-02 ENCOUNTER — Ambulatory Visit (HOSPITAL_COMMUNITY)
Admission: RE | Admit: 2010-09-02 | Discharge: 2010-09-02 | Disposition: A | Discharge status: Home / Self Care | Payer: BC Managed Care – PPO | Source: Ambulatory Visit | Attending: General Surgery | Admitting: General Surgery

## 2010-09-02 DIAGNOSIS — Z1231 Encounter for screening mammogram for malignant neoplasm of breast: Secondary | ICD-10-CM | POA: Insufficient documentation

## 2010-09-02 DIAGNOSIS — Z139 Encounter for screening, unspecified: Secondary | ICD-10-CM

## 2010-10-03 ENCOUNTER — Other Ambulatory Visit: Payer: Self-pay | Admitting: Family Medicine

## 2010-10-04 MED ORDER — CITALOPRAM HYDROBROMIDE 20 MG PO TABS: 20.0000 mg | ORAL_TABLET | Freq: Every day | ORAL | Status: DC

## 2010-10-04 NOTE — Telephone Encounter (Signed)
She is taking citalopram 20 mg 1 by mouth qd, ok to call #30, 1RF. Has not been seen in 8 months. Needs office visit before next refill

## 2010-10-04 NOTE — Telephone Encounter (Signed)
Rx Done. Note to pharmacy for ROV needed before further Rx authorizations. LMOM to inform Pt.

## 2010-10-30 IMAGING — CR DG THORACIC SPINE 2V
3 series · 3 of 3 positions shown · non-contrast
Comparison: None.

CLINICAL DATA: Back pain

THORACIC SPINE - 2 VIEW

[w t-spine lat *]
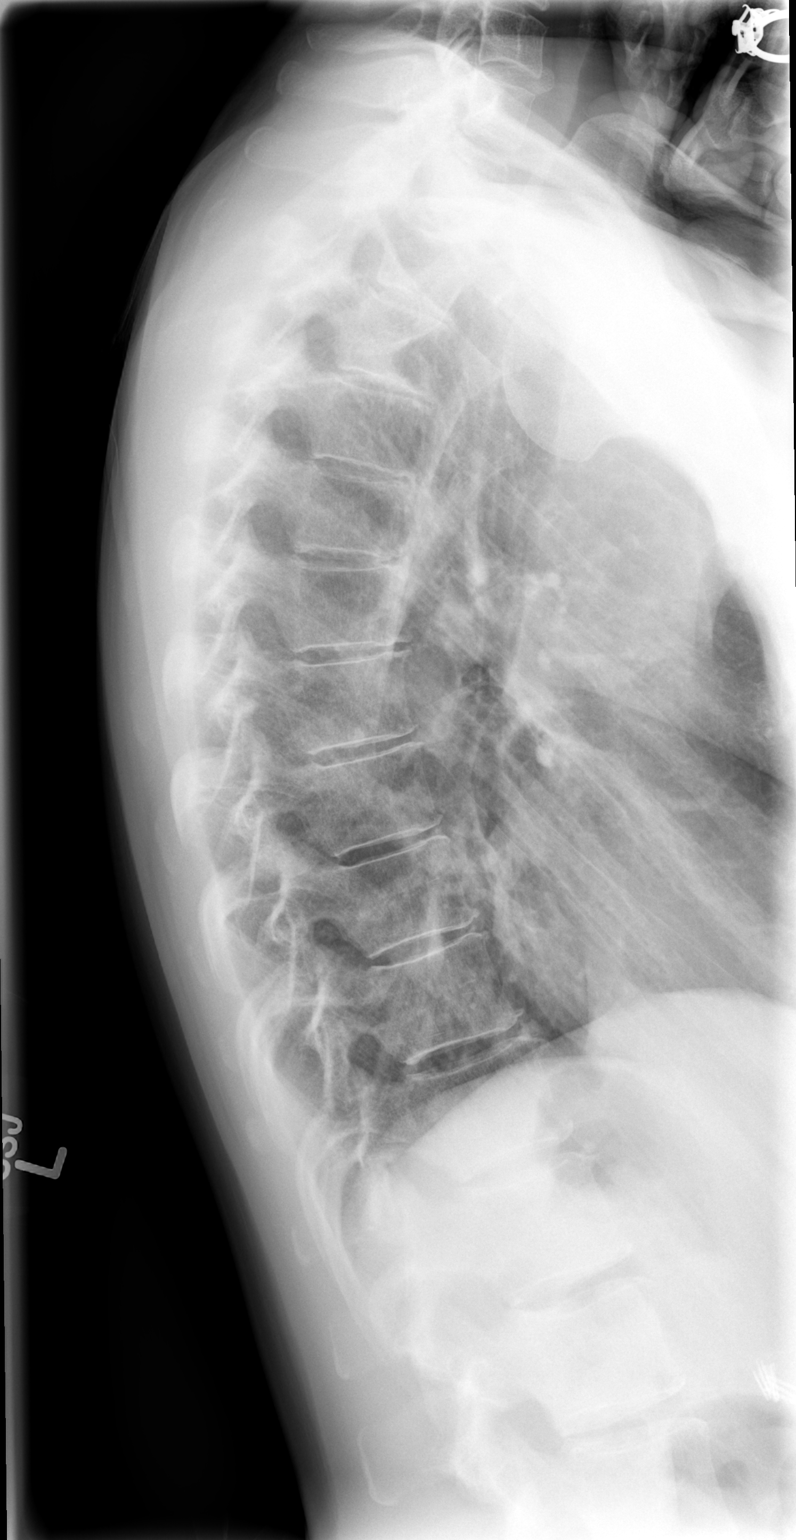

[w swimmers view]
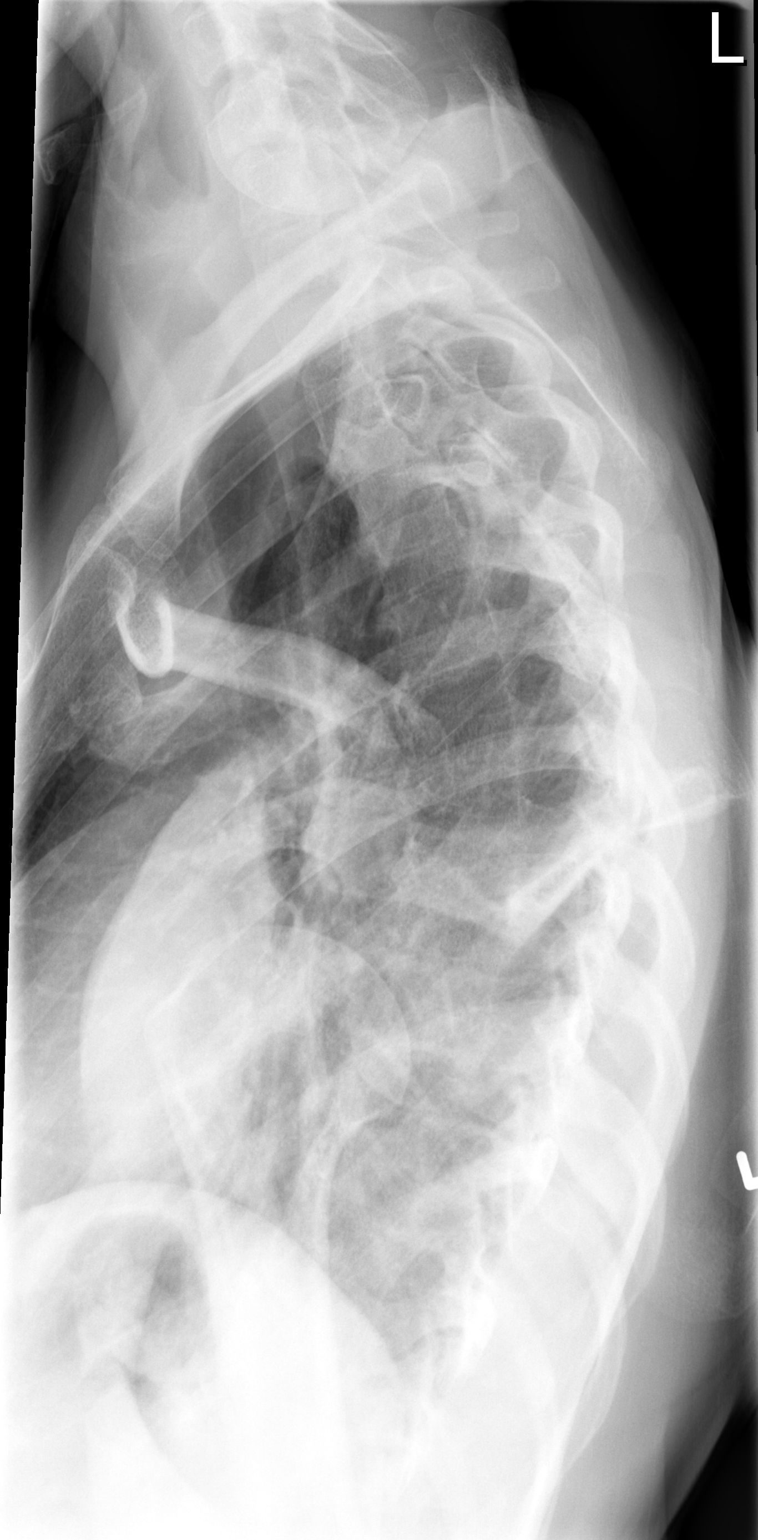

[w t-spine a.p. *]
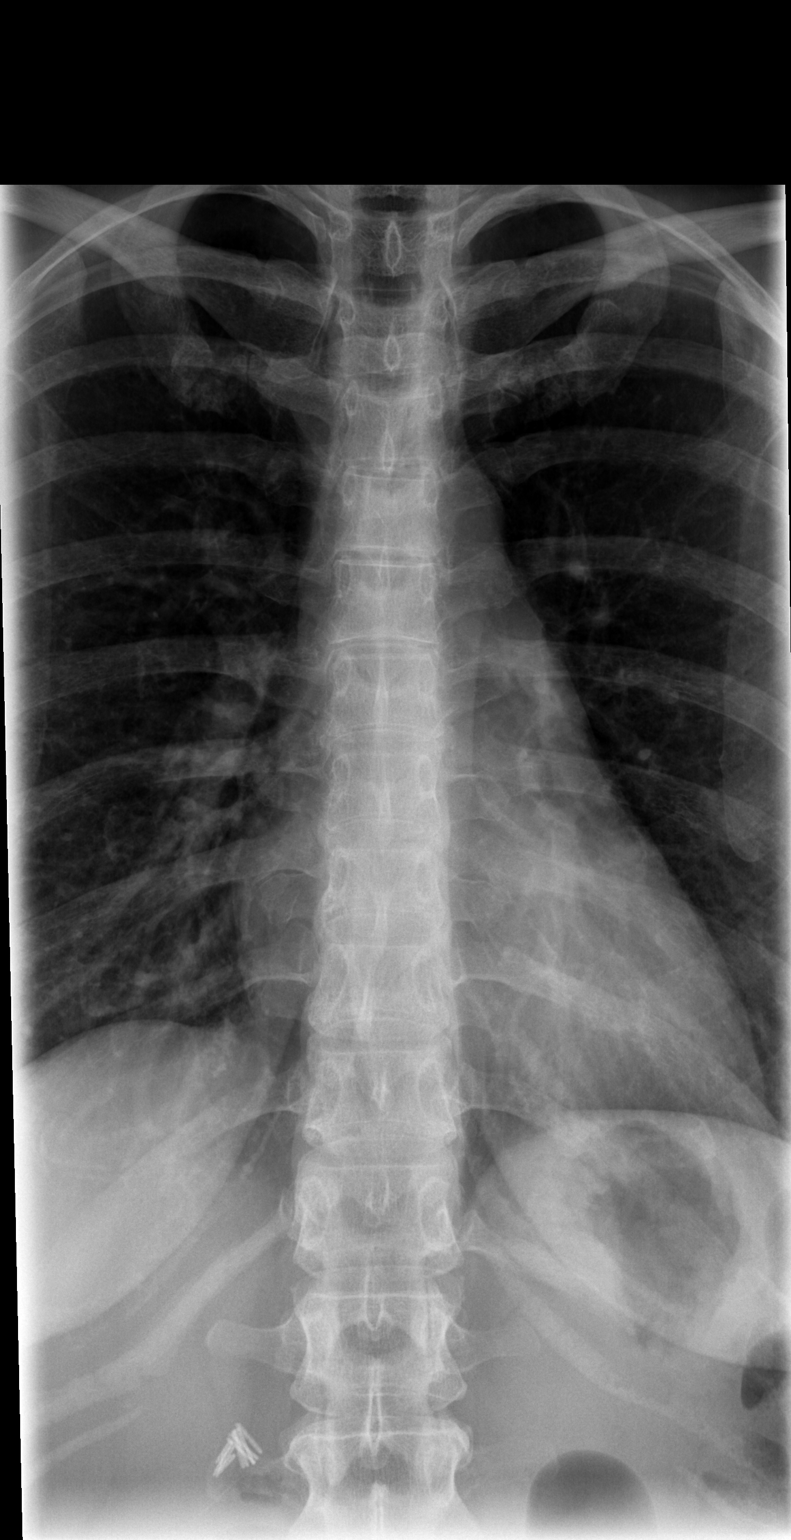

[3 of 3 positions shown; findings below may reference images not displayed]

FINDINGS: No fracture subluxation.  Intervertebral disc spaces are
preserved throughout.  No worrisome lytic or sclerotic osseous
lesion.  No abnormal paraspinal line.
IMPRESSION: Normal two-view thoracic spine exam.

## 2010-10-30 IMAGING — CR DG CHEST 2V
2 series · 2 of 2 positions shown · non-contrast
Comparison: None.

CLINICAL DATA: Shortness of breath and back pain

CHEST - 2 VIEW

[w chest pa]
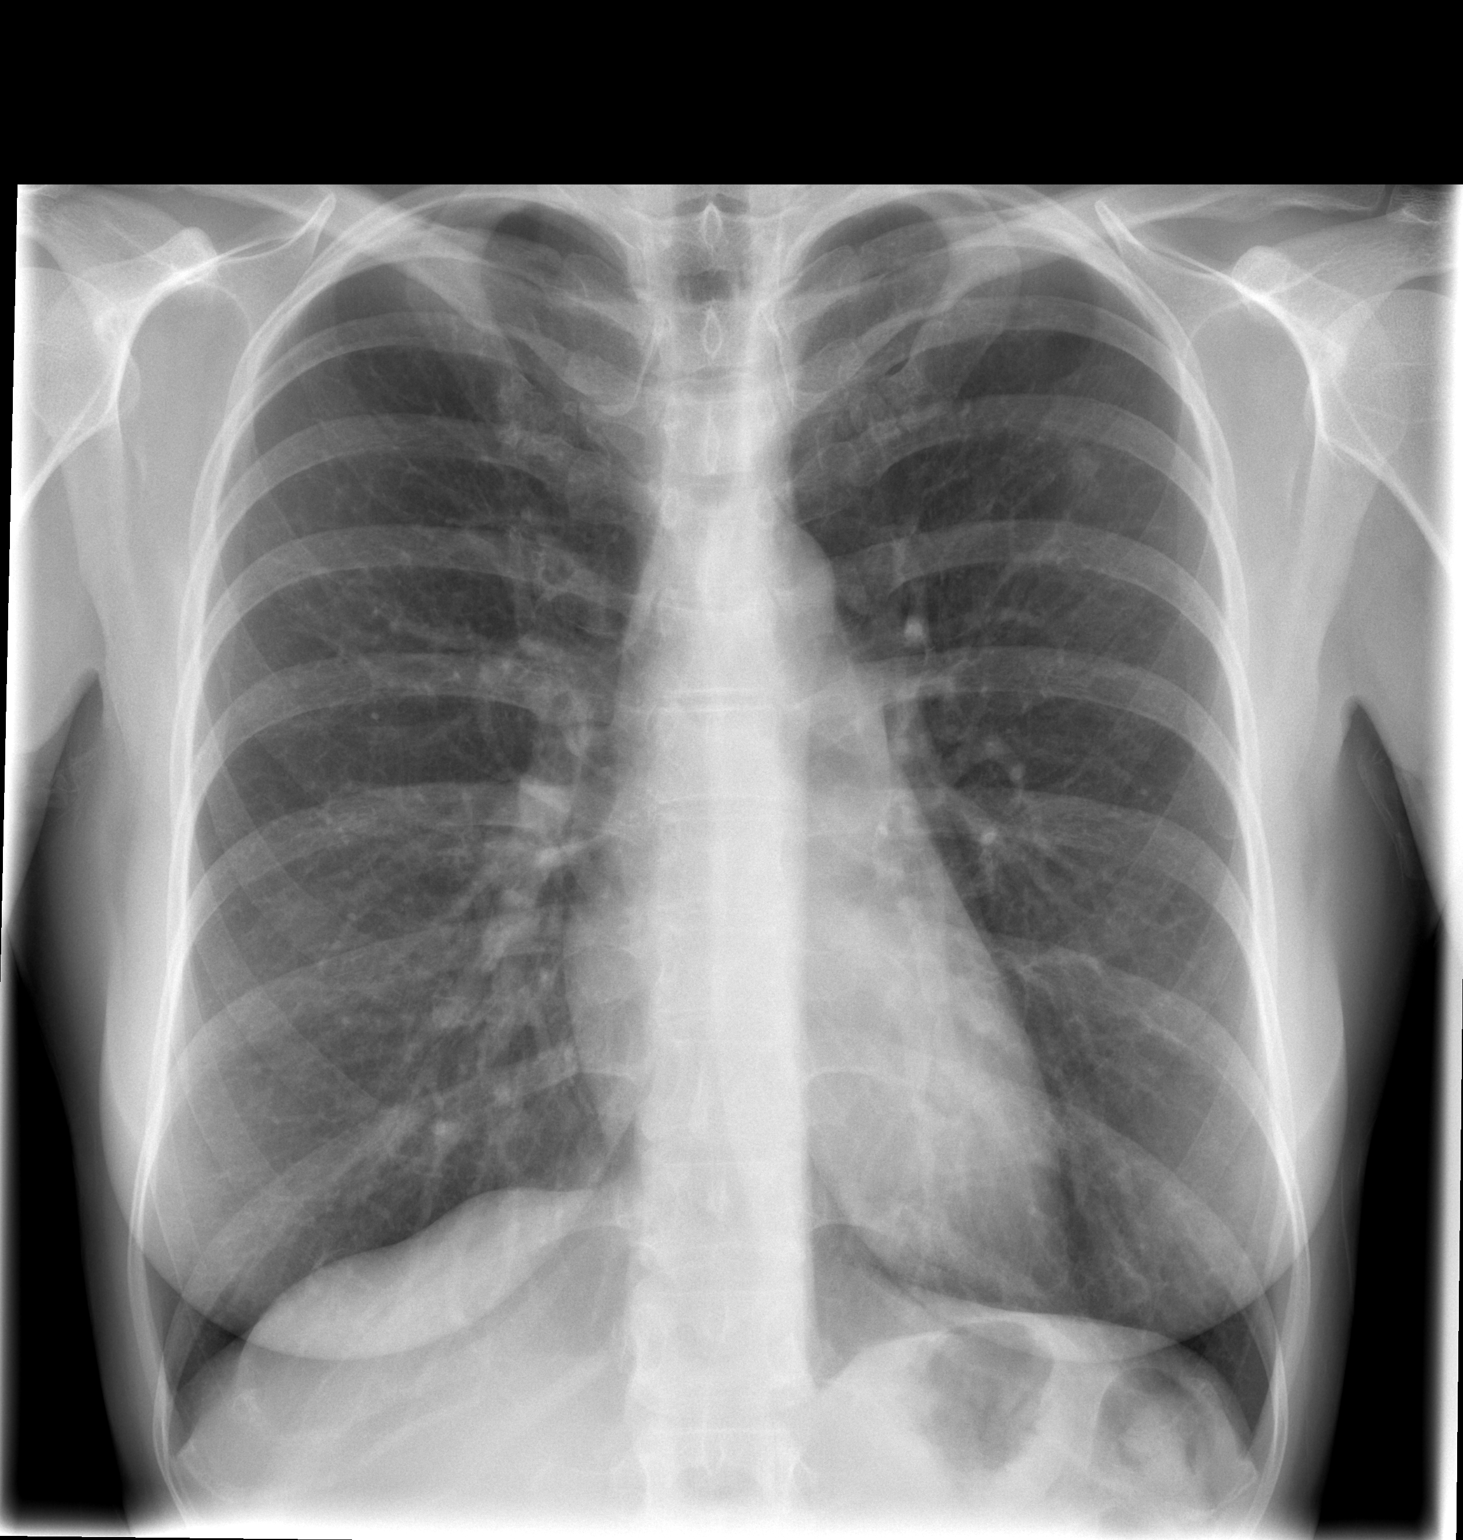

[w chest lat]
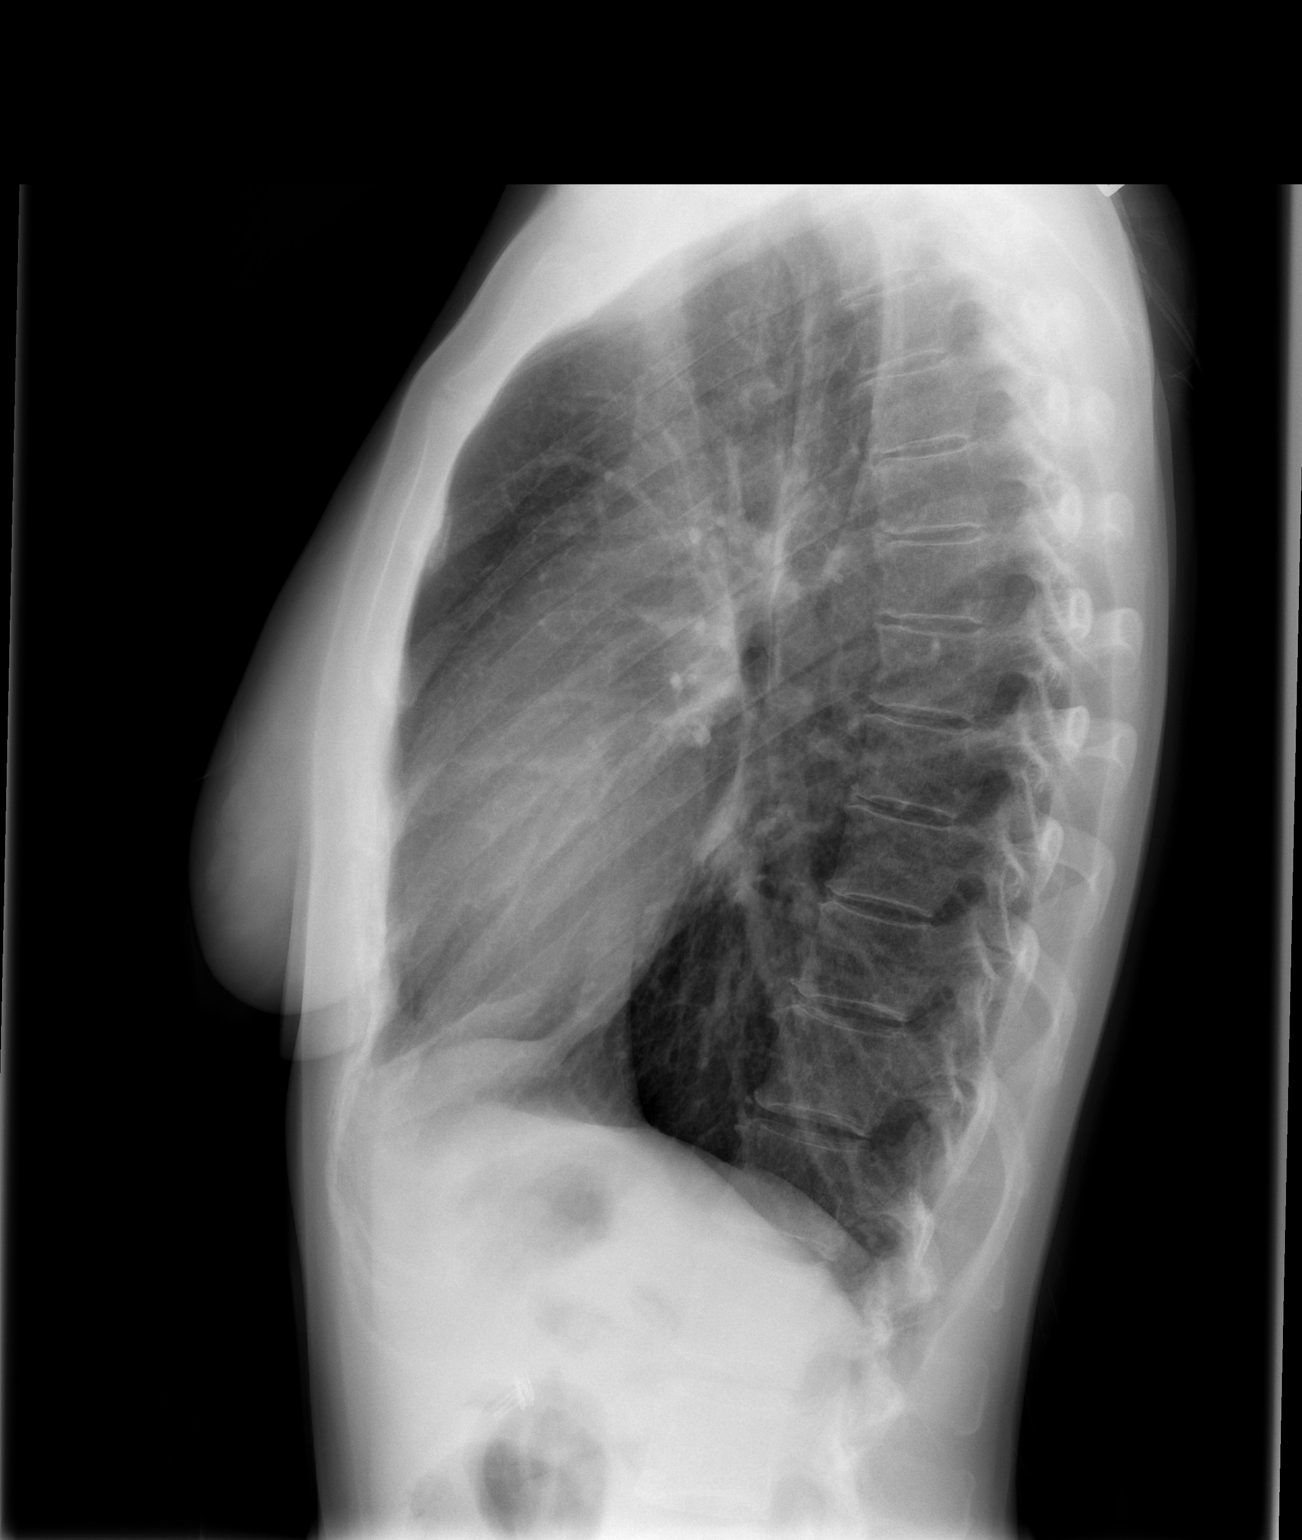

[2 of 2 positions shown; findings below may reference images not displayed]

FINDINGS: Two-view exam shows a small nodule in the left upper
lobe.  Right lung is clear.  Heart size is normal. Imaged bony
structures of the thorax are intact.
IMPRESSION: Small nodule in the left upper lobe.  CT chest without contrast
recommended to further evaluate.

## 2010-12-29 ENCOUNTER — Encounter: Payer: Self-pay | Admitting: Internal Medicine

## 2010-12-30 ENCOUNTER — Ambulatory Visit: Payer: BC Managed Care – PPO | Admitting: Internal Medicine

## 2010-12-30 ENCOUNTER — Other Ambulatory Visit: Payer: Self-pay | Admitting: Internal Medicine

## 2010-12-30 ENCOUNTER — Ambulatory Visit: Payer: BC Managed Care – PPO | Admitting: Family Medicine

## 2010-12-30 MED ORDER — CITALOPRAM HYDROBROMIDE 20 MG PO TABS: 20.0000 mg | ORAL_TABLET | Freq: Every day | ORAL | Status: DC

## 2010-12-30 NOTE — Telephone Encounter (Signed)
Done

## 2011-01-17 ENCOUNTER — Ambulatory Visit: Payer: BC Managed Care – PPO | Admitting: Internal Medicine

## 2011-02-22 ENCOUNTER — Other Ambulatory Visit: Payer: Self-pay | Admitting: Internal Medicine

## 2011-02-22 MED ORDER — CITALOPRAM HYDROBROMIDE 20 MG PO TABS: 20.0000 mg | ORAL_TABLET | Freq: Every day | ORAL | Status: DC

## 2011-02-22 NOTE — Telephone Encounter (Signed)
30 day supply of citalopram 20mg  sent to pharm.  Per 09/2010 note pt needs OV for more refills.  Pt has scheduled and cancelled several appts.  LMOM for pt to call office to schedule appt and only 30 day supply given until she has appt.

## 2011-04-04 ENCOUNTER — Ambulatory Visit (INDEPENDENT_AMBULATORY_CARE_PROVIDER_SITE_OTHER): Payer: BC Managed Care – PPO | Admitting: Internal Medicine

## 2011-04-04 ENCOUNTER — Encounter (INDEPENDENT_AMBULATORY_CARE_PROVIDER_SITE_OTHER): Payer: Self-pay | Admitting: Internal Medicine

## 2011-04-04 VITALS — BP 90/70 | HR 72 | Temp 98.7°F | Ht 64.5 in | Wt 138.6 lb

## 2011-04-04 DIAGNOSIS — R1011 Right upper quadrant pain: Secondary | ICD-10-CM

## 2011-04-04 LAB — COMPREHENSIVE METABOLIC PANEL
Albumin: 4.7 g/dL (ref 3.5–5.2)
Alkaline Phosphatase: 36 U/L — ABNORMAL LOW (ref 39–117)
Chloride: 107 mEq/L (ref 96–112)
Creat: 0.74 mg/dL (ref 0.50–1.10)
Glucose, Bld: 96 mg/dL (ref 70–99)
Total Bilirubin: 1.6 mg/dL — ABNORMAL HIGH (ref 0.3–1.2)
Total Protein: 7 g/dL (ref 6.0–8.3)

## 2011-04-04 NOTE — Patient Instructions (Signed)
cmet today. Further recommendations once we have the lab back.

## 2011-04-04 NOTE — Progress Notes (Signed)
Subjective:     Patient ID: Donna Lucas, female   DOB: 02/25/1971, 41 y.o.   MRN: [id number]  HPI  Donna Lucas is here today as a work in.  She tells me that she is having rt upper quadrant pain which comes and goes x 2 days. Sometimes it radiates into her back.  She has a hx of this and actually underwent an ERCP 2 yrs ago. 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  She tells me she is having rt upper quadrant pain. She rates the pain 4/10.   She also c/o chills x 2 night, but no fever.  03/24/2009 ERCP with biliary sphincterotomy and PD stenting. She underwent a cholecystectomy in November 2010 and remained with epigastric rt upper quadrant, intrascapular pain. She was noted to have elevated SGOT and SGPT> Her MRCP was negative for choledocholithiasis. Suspected to either have spincter of Oddi dysfunction or microlithiasis.  Found to have mildly dilated biliary system, without filling defects.   1.27/2012 CT abdomen and pelvis: IMPRESSION:  Small collapsed cyst right ovary.  No other significant intra abdominal or intrapelvic abnormalities.  Stable 3 mm left lower lobe pulmonary nodule.  Review of Systems see hpi  Current Outpatient Prescriptions  Medication Sig Dispense Refill  . citalopram (CELEXA) 20 MG tablet Take 10 mg by mouth daily.      Marland Kitchen omeprazole (PRILOSEC) 20 MG capsule Take 20 mg by mouth daily.      Marland Kitchen DISCONTD: citalopram (CELEXA) 20 MG tablet Take 1 tablet (20 mg total) by mouth daily.  30 tablet  0   Past Medical History  Diagnosis Date  . Anemia   . Elevated LFTs     after cholecystectomy  . Abnormal CT scan, chest 02-2009    solitary nodule,f/u by pulmonology, neck CT, April 2011   Past Surgical History  Procedure Date  . Ovarian cyst removal   . Cholecystectomy   . Ectopic pregnancy surgery   . Ercp     2 yrs ago after her GB removed   History   Social History  . Marital Status: Married    Spouse Name: N/A    Number of Children: 3  . Years of  Education: N/A   Occupational History  .     Social History Main Topics  . Smoking status: Former Games developer  . Smokeless tobacco: Not on file   Comment: greater than 20 yrs ago  . Alcohol Use: No  . Drug Use: Not on file  . Sexually Active: Not on file   Other Topics Concern  . Not on file   Social History Narrative  . No narrative on file   Family Status  Relation Status Death Age  . Other Alive   . Mother Deceased     TTP  . Father Alive     good health  . Sister Alive     good health   No Known Allergies   Objective:   Physical Exam Filed Vitals:   04/04/11 1120  Height: 5' 4.5" (1.638 m)  Weight: 138 lb 9.6 oz (62.869 kg)    Alert and oriented. Skin warm and dry. Oral mucosa is moist.   . Sclera anicteric, conjunctivae is pink. Thyroid not enlarged. No cervical lymphadenopathy. Lungs clear. Heart regular rate and rhythm.  Abdomen is soft. Bowel sounds are positive. No hepatomegaly. No abdominal masses felt. No tenderness.  No edema to lower extremities. Patient is alert and oriented.  Assessment:   Rt upper quadrant pain. She does tell me she feels better. Hx of possible Spincter of Oddi dysfuntion    Plan:    Will repeat a Hepatic function. Further recommendations once we have the results.

## 2011-04-06 ENCOUNTER — Telehealth (INDEPENDENT_AMBULATORY_CARE_PROVIDER_SITE_OTHER): Payer: Self-pay | Admitting: *Deleted

## 2011-04-06 NOTE — Telephone Encounter (Signed)
LM ask for Donna Lucas to return her call to [phone]. This is very important.

## 2011-04-06 NOTE — Telephone Encounter (Signed)
She will come by Monday and I will recheck a Cmet on her.  She continues to have rt upperquadrant pain.

## 2011-04-11 ENCOUNTER — Ambulatory Visit (INDEPENDENT_AMBULATORY_CARE_PROVIDER_SITE_OTHER): Payer: BC Managed Care – PPO | Admitting: Internal Medicine

## 2011-04-11 VITALS — BP 120/72 | HR 85 | Temp 98.5°F | Wt 139.0 lb

## 2011-04-11 DIAGNOSIS — Z8639 Personal history of other endocrine, nutritional and metabolic disease: Secondary | ICD-10-CM

## 2011-04-11 DIAGNOSIS — F411 Generalized anxiety disorder: Secondary | ICD-10-CM

## 2011-04-11 MED ORDER — CITALOPRAM HYDROBROMIDE 20 MG PO TABS: 10.0000 mg | ORAL_TABLET | Freq: Every day | ORAL | Status: DC

## 2011-04-11 NOTE — Assessment & Plan Note (Signed)
Multiple GI sx , sees a GI, need labs done today, will fax to Dr Valetta Fuller [phone]

## 2011-04-11 NOTE — Progress Notes (Signed)
  Subjective:    Patient ID: LEONETTA MCGIVERN, female    DOB: 04/29/1970, 41 y.o.   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 Has ongoing GI sx , N-D-abd pain. Needs labs done     Objective:   Physical Exam  Constitutional: She is oriented to person, place, and time. She appears well-developed and well-nourished. No distress.  HENT:  Head: Normocephalic and atraumatic.  Neurological: She is alert and oriented to person, place, and time.  Skin: She is not diaphoretic.  Psychiatric: She has a normal mood and affect. Her behavior is normal. Judgment and thought content normal.      Assessment & Plan:  RTC 1 year

## 2011-04-11 NOTE — Assessment & Plan Note (Signed)
Well controlled, RF 

## 2011-04-12 ENCOUNTER — Encounter: Payer: Self-pay | Admitting: Internal Medicine

## 2011-04-12 ENCOUNTER — Telehealth: Payer: Self-pay | Admitting: *Deleted

## 2011-04-12 LAB — HEPATIC FUNCTION PANEL
ALT: 8 U/L (ref 0–35)
AST: 19 U/L (ref 0–37)
Albumin: 4 g/dL (ref 3.5–5.2)
Alkaline Phosphatase: 32 U/L — ABNORMAL LOW (ref 39–117)
Bilirubin, Direct: 0.1 mg/dL (ref 0.0–0.3)
Total Protein: 6.9 g/dL (ref 6.0–8.3)

## 2011-04-12 NOTE — Telephone Encounter (Signed)
Labs look okay, we are faxing them to her GI doctor

## 2011-04-12 NOTE — Telephone Encounter (Signed)
Pt was seen yesterday and had labs drawn.  Pt would like results.  She is very nervous.  Please advise.

## 2011-04-12 NOTE — Telephone Encounter (Signed)
Faxed lab results

## 2011-05-08 ENCOUNTER — Ambulatory Visit (INDEPENDENT_AMBULATORY_CARE_PROVIDER_SITE_OTHER): Payer: BC Managed Care – PPO | Admitting: Internal Medicine

## 2011-05-08 ENCOUNTER — Encounter (INDEPENDENT_AMBULATORY_CARE_PROVIDER_SITE_OTHER): Payer: Self-pay | Admitting: Internal Medicine

## 2011-05-08 VITALS — BP 118/70 | HR 76 | Temp 97.7°F | Resp 14 | Ht 64.0 in | Wt 140.4 lb

## 2011-05-08 DIAGNOSIS — R1011 Right upper quadrant pain: Secondary | ICD-10-CM

## 2011-05-08 DIAGNOSIS — R1013 Epigastric pain: Secondary | ICD-10-CM

## 2011-05-08 MED ORDER — DICYCLOMINE HCL 10 MG PO CAPS: 10.0000 mg | ORAL_CAPSULE | Freq: Three times a day (TID) | ORAL | Status: DC

## 2011-05-08 NOTE — Patient Instructions (Addendum)
Call the office if you have another episode of severe right upper quadrant abdominal pain for you have side effects with dicyclomine.

## 2011-05-08 NOTE — Progress Notes (Signed)
Presenting complaint; Epigastric and right upper quadrant abdominal pain. Subjective:  Donna Lucas is a 41 year old Caucasian female who is here for scheduled visit for further evaluation of unrelenting right upper quadrant and epigastric pain. She was seen by [name] on 04/04/2011 and had normal LFTs. On the day of her visit she was feeling better but this. Did not last long. Patient states that her symptoms began in fall of 2010. She had cholecystectomy but experienced no relief of her pain. She was seen by me in January 2011 following an episode of severe pain associated with elevated transaminases. 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  Her transaminases return to normal but her symptoms have lingered on. While on most days her pain is mild she is at 2 episodes of severe pain. Now she is having right upper quadrant pain every day. She describes this pain as dull aching pain. Fasting eases pain as does defecation sometimes. She is not sure if this pain worsens with meals. She also complains of epigastric pain which occurs less often and described as sharp and burning and worse after meals and associated with nausea but no vomiting. She has good appetite. She denies vomiting melena or rectal bleeding. She also denies fever or chills. She states she is very busy. 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 She states her schedule was changed later this year.   Current Medications: Current Outpatient Prescriptions  Medication Sig Dispense Refill  . citalopram (CELEXA) 20 MG tablet Take 0.5 tablets (10 mg total) by mouth daily.  30 tablet  8  . dicyclomine (BENTYL) 10 MG capsule Take 1 capsule (10 mg total) by mouth 3 (three) times daily before meals.  90 capsule  2     Objective: Blood pressure 118/70, pulse 76, temperature 97.7 F (36.5 C), temperature source Oral, resp. rate 14, height 5\' 4"  (1.626 m), weight  140 lb 6.4 oz (63.685 kg), last menstrual period 05/08/2011.  Conjunctiva is pink. Sclera is nonicteric Oropharyngeal mucosa is normal. No neck masses or thyromegaly noted. Cardiac exam with regular rhythm normal S1 and S2. No murmur or gallop noted. Lungs are clear to auscultation. Abdomen is flat. Bowel sounds are hyperactive. On palpation abdomen is soft with mild tenderness at epigastrium on deep palpation. No organomegaly or masses  No LE edema or clubbing noted.  Labs/studies Results: Abdominopelvic CT from 03/25/2010 reviewed. 3 mm stable pulmonary nodule at left lower lobe.. Scant amount of air in biliary tree secondary to prior sphincterotomy.  Assessment:  Patient appears to have 2 different types of abdominal pain. One is right upper quadrant pain which is more or less constant not relieved with cholecystectomy and ERCP as above. Some features suggest musculoskeletal pain or IBS. Marland KitchenDoubt that she has developed papillary stenosis given normal transaminases She also has epigastric pain which is postprandial but did not respond to PPI. Her symptom complex may be due to dyspepsia and IBS.   Plan:  Dicyclomine 10 mg by mouth a.c.. Consider LFTs she has another episode of sharp pain. Office visit in 2 months.

## 2011-05-30 ENCOUNTER — Ambulatory Visit (INDEPENDENT_AMBULATORY_CARE_PROVIDER_SITE_OTHER): Payer: BC Managed Care – PPO | Admitting: Internal Medicine

## 2011-05-30 ENCOUNTER — Encounter (INDEPENDENT_AMBULATORY_CARE_PROVIDER_SITE_OTHER): Payer: Self-pay | Admitting: Internal Medicine

## 2011-05-30 VITALS — BP 92/64 | HR 80 | Temp 98.4°F | Ht 64.5 in | Wt 140.0 lb

## 2011-05-30 DIAGNOSIS — R109 Unspecified abdominal pain: Secondary | ICD-10-CM

## 2011-05-30 DIAGNOSIS — R1011 Right upper quadrant pain: Secondary | ICD-10-CM

## 2011-05-30 NOTE — Patient Instructions (Signed)

## 2011-05-30 NOTE — Progress Notes (Signed)
Subjective:     Patient ID: Donna Lucas, female   DOB: 1970-08-01, 41 y.o.   MRN: [id number]  HPI Donna Lucas is a 40 yr old female presenting today with c/o of rt upper quadrant pain and rt lower quadrant pain.  She tells me when she touches her rt flank it is very painful. She c/o of having 4 BMs a day. Most are firm but some are loose.   Patient states that her symptoms began in fall of 2010. She had cholecystectomy but experienced no relief of her pain.  She was seen by Dr Karilyn Cota in January 2011 following an episode of severe pain associated with elevated transaminases. 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 Her transaminases return to normal but her symptoms have lingered on.  Now she is having right upper quadrant pain every day. She describes this pain as dull aching pain. Fasting eases pain as does defecation sometimes.  . She also complains of epigastric pain which occurs less often and described as sharp and burning and worse after meals and associated with nausea but no vomiting. She has good appetite. She denies vomiting melena or rectal bleeding. She also denies fever or chills. She states she is very busy. 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 She states her schedule was changed later this year.  03/25/10 CT abdomen and pelvis with CM.:IMPRESSION:  Small collapsed cyst right ovary.  No other significant intra abdominal or intrapelvic abnormalities.  Stable 3 mm left lower lobe pulmonary nodule  03/14/2010 US abdomen complete. Prior cholecystectomy. No acute upper abdominal abnormalities. 03/19/2009 MRCP: recurring back and epigastric pain: Normal CBD without evidence of retained stone, stricture, or dilatation. (CBD 6mm in diameter which is at the upper limits of normal.)   Review of Systems Current Outpatient Prescriptions  Medication Sig Dispense Refill  . citalopram (CELEXA) 20 MG tablet Take 0.5 tablets  (10 mg total) by mouth daily.  30 tablet  8  . dicyclomine (BENTYL) 10 MG capsule Take 1 capsule (10 mg total) by mouth 3 (three) times daily before meals.  90 capsule  2  . DISCONTD: omeprazole (PRILOSEC) 20 MG capsule Take 20 mg by mouth daily.       Past Medical History  Diagnosis Date  . Anemia   . Elevated LFTs     after cholecystectomy  . Abnormal CT scan, chest 02-2009    solitary nodule,f/u by pulmonology, neck CT, April 2011  \ Past Surgical History  Procedure Date  . Ovarian cyst removal   . Cholecystectomy   . Ectopic pregnancy surgery   . Ercp     2 yrs ago after her GB removed   History   Social History  . Marital Status: Married    Spouse Name: N/A    Number of Children: 3  . Years of Education: N/A   Occupational History  .     Social History Main Topics  . Smoking status: Former Smoker    Types: Cigarettes    Quit date: 05/08/1991  . Smokeless tobacco: Never Used   Comment: greater than 20 yrs ago ,patient smokes less than 1/2 pack a day  . Alcohol Use: No  . Drug Use: No  . Sexually Active: Not on file   Other Topics Concern  . Not on file   Social History Narrative  . No narrative on file   Family Status  Relation  Status Death Age  . Other Alive   . Mother Deceased     TTP  . Father Alive     good health  . Sister Alive     good health  . Son Alive   . Son Alive   . Daughter Alive    No Known Allergies     Objective:   Physical Exam Filed Vitals:   05/30/11 1501  Height: 5' 4.5" (1.638 m)  Weight: 140 lb (63.504 kg)    Alert and oriented. Skin warm and dry. Oral mucosa is moist.   . Sclera anicteric, conjunctivae is pink. Thyroid not enlarged. No cervical lymphadenopathy. Lungs clear. Heart regular rate and rhythm.  Abdomen is soft. Bowel sounds are positive. No hepatomegaly. No abdominal masses felt.Tenderness rt upper quadrant. Tenderness rt flank.   No edema to lower extremities. Patient is alert and oriented.       Assessment:   Rt upper quadrant pain of ? Etiology. Rt flank pain So far her work up has been negative.     Plan:    Referral to [facility]. Cmet today. Will also get a urine on her today.

## 2011-05-31 LAB — URINALYSIS
Bilirubin Urine: NEGATIVE
Glucose, UA: NEGATIVE mg/dL
Leukocytes, UA: NEGATIVE
Specific Gravity, Urine: 1.01 (ref 1.005–1.030)

## 2011-05-31 LAB — COMPREHENSIVE METABOLIC PANEL
ALT: <8 U/L (ref 0–35)
Alkaline Phosphatase: 38 U/L — ABNORMAL LOW (ref 39–117)
BUN: 14 mg/dL (ref 6–23)
CO2: 25 mEq/L (ref 19–32)
Chloride: 105 mEq/L (ref 96–112)
Creat: 0.63 mg/dL (ref 0.50–1.10)
Glucose, Bld: 94 mg/dL (ref 70–99)
Potassium: 3.9 mEq/L (ref 3.5–5.3)
Sodium: 139 mEq/L (ref 135–145)
Total Bilirubin: 1.4 mg/dL — ABNORMAL HIGH (ref 0.3–1.2)
Total Protein: 6.7 g/dL (ref 6.0–8.3)

## 2011-06-13 ENCOUNTER — Telehealth (INDEPENDENT_AMBULATORY_CARE_PROVIDER_SITE_OTHER): Payer: Self-pay | Admitting: Internal Medicine

## 2011-06-13 ENCOUNTER — Other Ambulatory Visit (INDEPENDENT_AMBULATORY_CARE_PROVIDER_SITE_OTHER): Payer: Self-pay | Admitting: Internal Medicine

## 2011-06-13 NOTE — Telephone Encounter (Signed)
C/o itching.  Will check a cmet to be sure her bilirubin is not climbing.

## 2011-06-14 LAB — COMPREHENSIVE METABOLIC PANEL
CO2: 28 mEq/L (ref 19–32)
Calcium: 9.2 mg/dL (ref 8.4–10.5)
Chloride: 102 mEq/L (ref 96–112)
Creat: 0.6 mg/dL (ref 0.50–1.10)
Glucose, Bld: 113 mg/dL — ABNORMAL HIGH (ref 70–99)
Sodium: 138 mEq/L (ref 135–145)
Total Bilirubin: 1.3 mg/dL — ABNORMAL HIGH (ref 0.3–1.2)
Total Protein: 6.9 g/dL (ref 6.0–8.3)

## 2011-06-23 ENCOUNTER — Emergency Department (HOSPITAL_COMMUNITY): Payer: BC Managed Care – PPO

## 2011-06-23 ENCOUNTER — Encounter (HOSPITAL_COMMUNITY): Payer: Self-pay | Admitting: *Deleted

## 2011-06-23 ENCOUNTER — Emergency Department (HOSPITAL_COMMUNITY)
Admission: EM | Admit: 2011-06-23 | Discharge: 2011-06-23 | Disposition: A | Discharge status: Home / Self Care | Payer: BC Managed Care – PPO | Attending: Emergency Medicine | Admitting: Emergency Medicine

## 2011-06-23 DIAGNOSIS — J984 Other disorders of lung: Secondary | ICD-10-CM | POA: Insufficient documentation

## 2011-06-23 DIAGNOSIS — K2970 Gastritis, unspecified, without bleeding: Secondary | ICD-10-CM | POA: Insufficient documentation

## 2011-06-23 DIAGNOSIS — R112 Nausea with vomiting, unspecified: Secondary | ICD-10-CM | POA: Insufficient documentation

## 2011-06-23 DIAGNOSIS — K2990 Gastroduodenitis, unspecified, without bleeding: Secondary | ICD-10-CM | POA: Insufficient documentation

## 2011-06-23 LAB — CBC
HCT: 34.5 % — ABNORMAL LOW (ref 36.0–46.0)
Hemoglobin: 11.4 g/dL — ABNORMAL LOW (ref 12.0–15.0)
MCH: 28.4 pg (ref 26.0–34.0)
MCHC: 33 g/dL (ref 30.0–36.0)
MCV: 85.8 fL (ref 78.0–100.0)
RDW: 13.6 % (ref 11.5–15.5)

## 2011-06-23 LAB — URINALYSIS, ROUTINE W REFLEX MICROSCOPIC
Glucose, UA: NEGATIVE mg/dL
Leukocytes, UA: NEGATIVE
Nitrite: NEGATIVE
Protein, ur: NEGATIVE mg/dL

## 2011-06-23 LAB — DIFFERENTIAL
Basophils Relative: 1 % (ref 0–1)
Eosinophils Absolute: 0 10*3/uL (ref 0.0–0.7)
Eosinophils Relative: 1 % (ref 0–5)
Monocytes Absolute: 0.4 10*3/uL (ref 0.1–1.0)
Monocytes Relative: 10 % (ref 3–12)

## 2011-06-23 LAB — COMPREHENSIVE METABOLIC PANEL
Albumin: 3.8 g/dL (ref 3.5–5.2)
BUN: 9 mg/dL (ref 6–23)
Calcium: 9.2 mg/dL (ref 8.4–10.5)
Chloride: 105 mEq/L (ref 96–112)
Creatinine, Ser: 0.64 mg/dL (ref 0.50–1.10)
Total Bilirubin: 1.6 mg/dL — ABNORMAL HIGH (ref 0.3–1.2)
Total Protein: 7.1 g/dL (ref 6.0–8.3)

## 2011-06-23 LAB — URINE MICROSCOPIC-ADD ON

## 2011-06-23 LAB — LIPASE, BLOOD: Lipase: 35 U/L (ref 11–59)

## 2011-06-23 MED ORDER — SODIUM CHLORIDE 0.9 % IV SOLN
INTRAVENOUS | Status: DC
  Administered 2011-06-23: 11:00:00 via INTRAVENOUS

## 2011-06-23 MED ORDER — HYDROMORPHONE HCL PF 1 MG/ML IJ SOLN
1.0000 mg | Freq: Once | INTRAMUSCULAR | Status: AC
  Administered 2011-06-23: 1 mg via INTRAVENOUS
  Filled 2011-06-23: qty 1

## 2011-06-23 MED ORDER — IOHEXOL 300 MG/ML  SOLN
100.0000 mL | Freq: Once | INTRAMUSCULAR | Status: AC | PRN
  Administered 2011-06-23: 100 mL via INTRAVENOUS

## 2011-06-23 MED ORDER — ONDANSETRON HCL 8 MG PO TABS: 8.0000 mg | ORAL_TABLET | Freq: Three times a day (TID) | ORAL | Status: DC | PRN

## 2011-06-23 MED ORDER — ONDANSETRON HCL 4 MG/2ML IJ SOLN
4.0000 mg | Freq: Once | INTRAMUSCULAR | Status: AC
  Administered 2011-06-23: 4 mg via INTRAVENOUS
  Filled 2011-06-23: qty 2

## 2011-06-23 MED ORDER — HYDROCODONE-ACETAMINOPHEN 5-325 MG PO TABS: 2.0000 | ORAL_TABLET | ORAL | Status: DC | PRN

## 2011-06-23 MED ORDER — FAMOTIDINE 20 MG PO TABS: 20.0000 mg | ORAL_TABLET | Freq: Two times a day (BID) | ORAL | Status: DC

## 2011-06-23 MED ORDER — FAMOTIDINE IN NACL 20-0.9 MG/50ML-% IV SOLN
20.0000 mg | Freq: Once | INTRAVENOUS | Status: AC
  Administered 2011-06-23: 20 mg via INTRAVENOUS
  Filled 2011-06-23: qty 50

## 2011-06-23 NOTE — ED Notes (Signed)
Entered room to d/c pt, c/o cont. Nausea, md notified and meds ordered and given. Given ginger ale and crackers

## 2011-06-23 NOTE — ED Notes (Signed)
Pt presents to er with hx of chronic right upper quad pain that became worse over the past few days with stabbing and burning sensation, pain is associated with n/v. Pt states that Dr. Karilyn Cota has referred her to GI at [facility] hospital and that she is not able to get an appointment there "for awhile".

## 2011-06-23 NOTE — ED Notes (Signed)
Pt states iv zofran has not worked for her during this visit. Pt states she will take po phenergan at home.

## 2011-06-23 NOTE — Discharge Instructions (Signed)
Gastritis Gastritis is an inflammation (the body's way of reacting to injury and/or infection) of the stomach. It is often caused by viral or bacterial (germ) infections. It can also be caused by chemicals (including alcohol) and medications. This illness may be associated with generalized malaise (feeling tired, not well), cramps, and fever. The illness may last 2 to 7 days. If symptoms of gastritis continue, gastroscopy (looking into the stomach with a telescope-like instrument), biopsy (taking tissue samples), and/or blood tests may be necessary to determine the cause. Antibiotics will not affect the illness unless there is a bacterial infection present. One common bacterial cause of gastritis is an organism known as H. Pylori. This can be treated with antibiotics. Other forms of gastritis are caused by too much acid in the stomach. They can be treated with medications such as H2 blockers and antacids. Home treatment is usually all that is needed. Young children will quickly become dehydrated (loss of body fluids) if vomiting and diarrhea are both present. Medications may be given to control nausea. Medications are usually not given for diarrhea unless especially bothersome. Some medications slow the removal of the virus from the gastrointestinal tract. This slows down the healing process. HOME CARE INSTRUCTIONS Home care instructions for nausea and vomiting:  For adults: drink small amounts of fluids often. Drink at least 2 quarts a day. Take sips frequently. Do not drink large amounts of fluid at one time. This may worsen the nausea.   Only take over-the-counter or prescription medicines for pain, discomfort, or fever as directed by your caregiver.   Drink clear liquids only. Those are anything you can see through such as water, broth, or soft drinks.   Once you are keeping clear liquids down, you may start full liquids, soups, juices, and ice cream or sherbet. Slowly add bland (plain, not spicy)  foods to your diet.  Home care instructions for diarrhea:  Diarrhea can be caused by bacterial infections or a virus. Your condition should improve with time, rest, fluids, and/or anti-diarrheal medication.   Until your diarrhea is under control, you should drink clear liquids often in small amounts. Clear liquids include: water, broth, jell-o water and weak tea.  Avoid:  Milk.   Fruits.   Tobacco.   Alcohol.   Extremely hot or cold fluids.   Too much intake of anything at one time.  When your diarrhea stops you may add the following foods, which help the stool to become more formed:  Rice.   Bananas.   Apples without skin.   Dry toast.  Once these foods are tolerated you may add low-fat yogurt and low-fat cottage cheese. They will help to restore the normal bacterial balance in your bowel. Wash your hands well to avoid spreading bacteria (germ) or virus. SEEK IMMEDIATE MEDICAL CARE IF:   You are unable to keep fluids down.   Vomiting or diarrhea become persistent (constant).   Abdominal pain develops, increases, or localizes. (Right sided pain can be appendicitis. Left sided pain in adults can be diverticulitis.)   You develop a fever (an oral temperature above 102 F (38.9 C)).   Diarrhea becomes excessive or contains blood or mucus.   You have excessive weakness, dizziness, fainting or extreme thirst.   You are not improving or you are getting worse.   You have any other questions or concerns.  Document Released: 02/07/2001 Document Revised: 02/02/2011 Document Reviewed: 02/13/2005 Exit[employer] Patient Information 2012 [employer]. 

## 2011-06-23 NOTE — ED Provider Notes (Signed)
History    This chart was scribed for Felisa Bonier, MD, MD by [name]. The patient was seen in room APA01 and the patient's care was started at 12:37PM.   CSN: [id number]  Arrival date & time 06/23/11  0940   First MD Initiated Contact with Patient 06/23/11 1201      Chief Complaint  Patient presents with  . Abdominal Pain  . Nausea  . Emesis    (Consider location/radiation/quality/duration/timing/severity/associated sxs/prior treatment) The history is provided by the patient.   Donna Lucas is a 41 y.o. female who presents to the Emergency Department complaining of intermittent moderate RUQ pain onset "awhile ago" (clarified as "months", unchanged, evaluated recently by Dr. Karilyn Cota of GI with no findings or explanation of the cause of symptoms, referred to [facility] in late May for further evaluation) but the patient reports, "I just couldn't take it anymore and couldn't wait for my appointment so I came in (to the ED)." Pt describes the pain as stabbing and burning, gnawing sensation. Pt reports having nausea. Pt reports that her last bilirubin was 1.3 1 week ago. Denies vomiting. 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 Reports that last CT scan was January 2012. Pt reports having diarrhea approximately 3x/day. She reports that they are watery without blood or mucous. Pt has family hx of pancreatic cancer and on stating this she confesses that the actual reason for presentation today is that she has become "terrified" that she has pancreatic cancer and seeks to refute this belief during today's visit to attain peace of mind, after which she feels that she can cope with her symptoms pending her follow up appointment. Denies drinking alcohol. Denies radiation of pain.  PCP is Dr. Drue Novel  Past Medical History  Diagnosis Date  . Anemia   . Elevated LFTs     after cholecystectomy  . Abnormal CT scan, chest 02-2009    solitary nodule,f/u by  pulmonology, neck CT, April 2011    Past Surgical History  Procedure Date  . Ovarian cyst removal   . Cholecystectomy   . Ectopic pregnancy surgery   . Ercp     2 yrs ago after her GB removed    Family History  Problem Relation Age of Onset  . Cancer Other     ovarian; breast  . Healthy Son   . Healthy Son   . Healthy Daughter     History  Substance Use Topics  . Smoking status: Former Smoker    Types: Cigarettes    Quit date: 05/08/1991  . Smokeless tobacco: Never Used   Comment: greater than 20 yrs ago ,patient smokes less than 1/2 pack a day  . Alcohol Use: No    OB History    Grav Para Term Preterm Abortions TAB SAB Ect Mult Living                  Review of Systems  All other systems reviewed and are negative.   10 Systems reviewed and all are negative for acute change except as noted in the HPI.   Allergies  Review of patient's allergies indicates no known allergies.  Home Medications   Current Outpatient Rx  Name Route Sig Dispense Refill  . CITALOPRAM HYDROBROMIDE 20 MG PO TABS Oral Take 0.5 tablets (10 mg total) by mouth daily. 30 tablet 8  . DIPHENHYDRAMINE HCL 25 MG PO TABS Oral Take 25 mg by mouth every 6 (  six) hours as needed. FOR ALLERGIES    . FAMOTIDINE 20 MG PO TABS Oral Take 1 tablet (20 mg total) by mouth 2 (two) times daily. 28 tablet 0  . HYDROCODONE-ACETAMINOPHEN 5-325 MG PO TABS Oral Take 2 tablets by mouth every 4 (four) hours as needed for pain. 20 tablet 0  . ONDANSETRON HCL 8 MG PO TABS Oral Take 1 tablet (8 mg total) by mouth every 8 (eight) hours as needed for nausea. 12 tablet 0    BP 105/61  Pulse 89  Temp 98.8 F (37.1 C)  Resp 18  Ht 5' 4.5" (1.638 m)  Wt 138 lb (62.596 kg)  BMI 23.32 kg/m2  SpO2 100%  LMP 06/04/2011  Physical Exam  Nursing note and vitals reviewed. Constitutional: She is oriented to person, place, and time. She appears well-developed and well-nourished. No distress.  HENT:  Head: Normocephalic  and atraumatic.  Mouth/Throat: Oropharynx is clear and moist.  Eyes: Conjunctivae and EOM are normal. Pupils are equal, round, and reactive to light. No scleral icterus.  Neck: Normal range of motion. Neck supple. No JVD present. No tracheal deviation present. No thyromegaly present.  Cardiovascular: Normal rate, regular rhythm and normal heart sounds.  Exam reveals no gallop and no friction rub.   No murmur heard. Pulmonary/Chest: Effort normal and breath sounds normal. No stridor. No respiratory distress. She has no wheezes. She has no rales. She exhibits no tenderness.  Abdominal: Soft. Normal appearance and bowel sounds are normal. She exhibits no distension, no ascites, no pulsatile midline mass and no mass. There is no hepatosplenomegaly. There is no tenderness. There is no rigidity, no rebound, no guarding, no CVA tenderness and negative Murphy's sign.  Musculoskeletal: Normal range of motion. She exhibits no edema and no tenderness.  Lymphadenopathy:    She has no cervical adenopathy.  Neurological: She is alert and oriented to person, place, and time. No cranial nerve deficit.  Skin: Skin is warm and dry. No rash noted. She is not diaphoretic. No erythema.  Psychiatric: She has a normal mood and affect.    ED Course  Procedures (including critical care time) DIAGNOSTIC STUDIES: Oxygen Saturation is 100% on room air, normal by my interpretation.    COORDINATION OF CARE: 12:43PM EDP discusses pt ED treatment with pt.  11:00 PM EDP orders medication: dlaudid 1 mg, zofran 4 mg, pepcid 20 mg, 0.9% NaCl infusion    Labs Reviewed  URINALYSIS, ROUTINE W REFLEX MICROSCOPIC - Abnormal; Notable for the following:    Hgb urine dipstick TRACE (*)    All other components within normal limits  CBC - Abnormal; Notable for the following:    Hemoglobin 11.4 (*)    HCT 34.5 (*)    All other components within normal limits  COMPREHENSIVE METABOLIC PANEL - Abnormal; Notable for the following:      Glucose, Bld 113 (*)    Total Bilirubin 1.6 (*)    All other components within normal limits  PREGNANCY, URINE  DIFFERENTIAL  LIPASE, BLOOD  URINE MICROSCOPIC-ADD ON   Ct Abdomen Pelvis W Contrast  06/23/2011  *RADIOLOGY REPORT*  Clinical Data: Abdominal pain, nausea and emesis  CT ABDOMEN AND PELVIS WITH CONTRAST  Technique:  Multidetector CT imaging of the abdomen and pelvis was performed following the standard protocol during bolus administration of intravenous contrast.  Contrast: OMNIPAQUE IOHEXOL 300 MG/ML  SOLN  Comparison: 03/25/2010  Findings: The lung bases appear clear.  In the left base there is a small  nodule measuring 3.9 mm, image #9.  Unchanged from 2008 and most likely benign.  No focal liver abnormality.  Prior cholecystectomy.  Mild increased caliber of the common bile duct which measures 6.8 mm.  This is similar to previous exam. Pancreatic duct is normal.  The pancreas has a normal appearance The spleen is negative.  Both adrenal glands are negative.  The left kidney has a normal appearance.  Small hypodense structure within the posterior cortex of the right kidney is too small to characterize measuring 5.7 mm, image 35.  There is no upper abdominal adenopathy.  There is no pelvic or inguinal adenopathy.  Trace amount of free fluid is in the pelvis.  The uterus and adnexal structures have a normal physiologic appearance for the patient's age.  The stomach appears normal.  The small bowel loops are normal in caliber.  The appendix is difficult to visualize separate from the right lower quadrant bowel loops.  No secondary signs of appendicitis. The colon is normal.  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  IMPRESSION:  1.  No acute findings identified within the abdomen or pelvis. 2.  Similar caliber of common bile duct which measures up to 6.8 mm.  No intrahepatic biliary dilatation noted. 3.  Pulmonary nodule in the  left base is unchanged from 2008 and is most likely benign.  Original Report Authenticated By: Rosealee Albee, M.D.     1. Gastritis       MDM  Bilirubin slightly elevated but no leukocytosis, fever, or other transaminase elevation to further indict hepatic cause of symptoms.  CT reviewed without suggestion of pancreatic cancer, perforated gastric ulcer, pancreatic inflammation, or other findings.  Biliary dilatation unchanged as is pulmonary nodule, suggesting that it is benign.  No lipase elevation to suggest pancreatitis.  Urinalysis not overtly suggestive of UTI especially when paired with physical exam and history.  Pt. Is not pregnant.  Cause of discomfort most likely peptic ulcer disease or gastritis which the patient has history of.  Outpatient gi follow up with symptomatic treatment as needed.  Pt. States understanding of and agreement with this impression, diagnosis, and treatment plan.  I personally performed the services described in this documentation, which was scribed in my presence. The recorded information has been reviewed and considered.        Felisa Bonier, MD 06/24/11 [phone]

## 2011-06-29 ENCOUNTER — Encounter (HOSPITAL_COMMUNITY): Payer: Self-pay | Admitting: Emergency Medicine

## 2011-06-29 ENCOUNTER — Emergency Department (HOSPITAL_COMMUNITY)
Admission: EM | Admit: 2011-06-29 | Discharge: 2011-06-29 | Disposition: A | Discharge status: Home / Self Care | Payer: BC Managed Care – PPO | Attending: Emergency Medicine | Admitting: Emergency Medicine

## 2011-06-29 DIAGNOSIS — R10811 Right upper quadrant abdominal tenderness: Secondary | ICD-10-CM | POA: Insufficient documentation

## 2011-06-29 DIAGNOSIS — R110 Nausea: Secondary | ICD-10-CM | POA: Insufficient documentation

## 2011-06-29 DIAGNOSIS — R000 Tachycardia, unspecified: Secondary | ICD-10-CM | POA: Insufficient documentation

## 2011-06-29 DIAGNOSIS — R197 Diarrhea, unspecified: Secondary | ICD-10-CM | POA: Insufficient documentation

## 2011-06-29 LAB — CBC
Hemoglobin: 11.9 g/dL — ABNORMAL LOW (ref 12.0–15.0)
MCHC: 33.5 g/dL (ref 30.0–36.0)
WBC: 6.7 10*3/uL (ref 4.0–10.5)

## 2011-06-29 LAB — COMPREHENSIVE METABOLIC PANEL
AST: 18 U/L (ref 0–37)
Albumin: 4 g/dL (ref 3.5–5.2)
Alkaline Phosphatase: 41 U/L (ref 39–117)
BUN: 7 mg/dL (ref 6–23)
Chloride: 106 mEq/L (ref 96–112)
Potassium: 3.5 mEq/L (ref 3.5–5.1)
Total Bilirubin: 1.6 mg/dL — ABNORMAL HIGH (ref 0.3–1.2)

## 2011-06-29 LAB — DIFFERENTIAL
Basophils Absolute: 0 10*3/uL (ref 0.0–0.1)
Basophils Relative: 0 % (ref 0–1)
Monocytes Relative: 7 % (ref 3–12)
Neutro Abs: 5 10*3/uL (ref 1.7–7.7)
Neutrophils Relative %: 74 % (ref 43–77)

## 2011-06-29 MED ORDER — SODIUM CHLORIDE 0.9 % IV BOLUS (SEPSIS)
1500.0000 mL | Freq: Once | INTRAVENOUS | Status: AC
  Administered 2011-06-29: 1500 mL via INTRAVENOUS

## 2011-06-29 MED ORDER — SODIUM CHLORIDE 0.9 % IV SOLN: INTRAVENOUS | Status: DC

## 2011-06-29 MED ORDER — METOCLOPRAMIDE HCL 5 MG/ML IJ SOLN
10.0000 mg | Freq: Once | INTRAMUSCULAR | Status: AC
  Administered 2011-06-29: 10 mg via INTRAVENOUS
  Filled 2011-06-29: qty 2

## 2011-06-29 MED ORDER — DIPHENHYDRAMINE HCL 50 MG/ML IJ SOLN
25.0000 mg | Freq: Once | INTRAMUSCULAR | Status: AC
  Administered 2011-06-29: 25 mg via INTRAVENOUS
  Filled 2011-06-29: qty 1

## 2011-06-29 MED ORDER — PROMETHAZINE HCL 25 MG RE SUPP: 25.0000 mg | Freq: Four times a day (QID) | RECTAL | Status: DC | PRN

## 2011-06-29 MED ORDER — PANTOPRAZOLE SODIUM 40 MG IV SOLR
40.0000 mg | Freq: Once | INTRAVENOUS | Status: AC
  Administered 2011-06-29: 40 mg via INTRAVENOUS
  Filled 2011-06-29: qty 40

## 2011-06-29 NOTE — ED Provider Notes (Signed)
History    This chart was scribed for [name], MD, MD by [name]. The patient was seen in room APA12 and the patient's care was started at 7:59AM.   CSN: [id number]  Arrival date & time 06/29/11  8295   First MD Initiated Contact with Patient 06/29/11 [phone]      Chief Complaint  Patient presents with  . Nausea  . Fatigue  . Diarrhea    (Consider location/radiation/quality/duration/timing/severity/associated sxs/prior treatment) The history is provided by the patient.   Donna Lucas is a 41 y.o. female who presents to the Emergency Department complaining of moderate intermittent burning  RUQ pain onset 2 months ago. She reports that she has had RUQ pain for 2 years but has worsened past 2 days. Pt reports that for past 2 weeks she has had nausea without vomiting. She was in ED last week and was diagnosed with gastritis and was given Pepcid (2x/day) and anti-nausea medication with minor relief. She reports having diarrhea (loose stool) that she has had off and on but that it  is light tan onset 2 days ago (constant) stating she is having it every few minutes. She has sphincterotomy for spinctor of Oddi dysfunction (Dr. Karilyn Cota Jan 2012) and cholecystomy 2011. She reports short period of relief after the spinchterotomy. She has been seen by Dr Karilyn Cota and has a referral appt at [facility] on the 23rd of this month. Denies fever. She states she feels light headed and shaky. . Denies weight loss. There is no radiation of pain. Pt states she is concerned she has pancreatic cancer because of a relative who died at young age of same, but she had a APCT done on 4/26 without pancreatic mass.   PCP is Dr. Drue Novel GI Dr Karilyn Cota  Past Medical History  Diagnosis Date  . Anemia   . Elevated LFTs     after cholecystectomy  . Abnormal CT scan, chest 02-2009    solitary nodule,f/u by pulmonology, neck CT, April 2011    Past Surgical History  Procedure Date  . Ovarian cyst removal   . Cholecystectomy  2011   . Ectopic pregnancy surgery   . Ercp with spinterotomy Jan 2012     2 yrs ago after her GB removed    Family History  Problem Relation Age of Onset  . Cancer Other     ovarian; breast  . Healthy Son   . Healthy Son   . Healthy Daughter   Sister IBS Uncle died of pancreatic cancer at young age  History  Substance Use Topics  . Smoking status: Former Smoker    Types: Cigarettes    Quit date: 05/08/1991  . Smokeless tobacco: Never Used   Comment: greater than 20 yrs ago ,patient smokes less than 1/2 pack a day  . 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   10 Systems reviewed and all are negative for acute change except as noted in the HPI.   Allergies  Review of patient's allergies indicates no known allergies.  Home Medications   Current Outpatient Rx  Name Route Sig Dispense Refill  . CITALOPRAM HYDROBROMIDE 20 MG PO TABS Oral Take 0.5 tablets (10 mg total) by mouth daily. 30 tablet  8  . DIPHENHYDRAMINE HCL 25 MG PO TABS Oral Take 25 mg by mouth every 6 (six) hours as needed. FOR ALLERGIES    . FAMOTIDINE 20 MG PO TABS Oral Take 1 tablet (20 mg total) by mouth 2 (two) times daily. 28 tablet 0  . HYDROCODONE-ACETAMINOPHEN 5-325 MG PO TABS Oral Take 2 tablets by mouth every 4 (four) hours as needed for pain. 20 tablet 0  . ONDANSETRON HCL 8 MG PO TABS Oral Take 1 tablet (8 mg total) by mouth every 8 (eight) hours as needed for nausea. 12 tablet 0    BP 125/80  Pulse 102  Temp 98.8 F (37.1 C)  Resp 18  Ht 5' 4.5" (1.638 m)  Wt 135 lb (61.236 kg)  BMI 22.81 kg/m2  SpO2 100%  LMP 06/29/2011  Vital signs normal except tachycardia   Physical Exam  Nursing note and vitals reviewed. Constitutional: She is oriented to person, place, and time. She appears well-developed and  well-nourished. No distress.  HENT:  Head: Normocephalic and atraumatic.  Right Ear: External ear normal.  Left Ear: External ear normal.  Nose: Nose normal.       Dry tongue  Eyes: Conjunctivae and EOM are normal. Pupils are equal, round, and reactive to light.  Neck: Normal range of motion. Neck supple. No thyromegaly present.  Cardiovascular: Regular rhythm and normal heart sounds.  Tachycardia present.   Pulmonary/Chest: Effort normal and breath sounds normal. No respiratory distress.  Abdominal: Soft. Bowel sounds are normal. She exhibits no distension and no mass. There is tenderness (medial RUQ). There is no rebound and no guarding.  Neurological: She is alert and oriented to person, place, and time.  Skin: Skin is warm and dry.  Psychiatric: Her behavior is normal.       Flat affect    ED Course  Procedures (including critical care time)   Medications  0.9 %  sodium chloride infusion (not administered)  promethazine (PHENERGAN) 25 MG suppository (not administered)  sodium chloride 0.9 % bolus 1,500 mL (1500 mL Intravenous Given 06/29/11 0827)  metoCLOPramide (REGLAN) injection 10 mg (10 mg Intravenous Given 06/29/11 0829)  diphenhydrAMINE (BENADRYL) injection 25 mg (25 mg Intravenous Given 06/29/11 0827)  pantoprazole (PROTONIX) injection 40 mg (40 mg Intravenous Given 06/29/11 0830)   Recheck feeling better. Rectal done, (on menses), had small amount of yellow brown stool, hemoccult not done b/o her menses and likelihood it would be +, Pt questioning if anxiety could be making/causing her symptoms, states she has been on celexa for years. 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    DIAGNOSTIC STUDIES: Oxygen Saturation is 100% on room air, normal by my interpretation.    COORDINATION OF CARE: 8:05AM EDP discusses pt ED treatment course with pt.  8:05AM EDP orders medication: 0.9% NaCl infusion, reglan 10 mg, benadryl 25 mg, protonix  40 mg   9:57AM EDP rechecks pt. Pt is feeling better. EDP discusses pt lab results with pt. Pt is ready for discharge.    Results for orders placed during the hospital encounter of 06/29/11  CBC      Component Value Range   WBC 6.7  4.0 - 10.5 (K/uL)   RBC 4.15  3.87 - 5.11 (MIL/uL)   Hemoglobin 11.9 (*) 12.0 - 15.0 (g/dL)   HCT 40.9 (*) 81.1 - 46.0 (%)   MCV 85.5  78.0 - 100.0 (fL)   MCH 28.7  26.0 - 34.0 (pg)  MCHC 33.5  30.0 - 36.0 (g/dL)   RDW 16.1  09.6 - 04.5 (%)   Platelets 259  150 - 400 (K/uL)  DIFFERENTIAL      Component Value Range   Neutrophils Relative 74  43 - 77 (%)   Neutro Abs 5.0  1.7 - 7.7 (K/uL)   Lymphocytes Relative 18  12 - 46 (%)   Lymphs Abs 1.2  0.7 - 4.0 (K/uL)   Monocytes Relative 7  3 - 12 (%)   Monocytes Absolute 0.5  0.1 - 1.0 (K/uL)   Eosinophils Relative 0  0 - 5 (%)   Eosinophils Absolute 0.0  0.0 - 0.7 (K/uL)   Basophils Relative 0  0 - 1 (%)   Basophils Absolute 0.0  0.0 - 0.1 (K/uL)  COMPREHENSIVE METABOLIC PANEL      Component Value Range   Sodium 141  135 - 145 (mEq/L)   Potassium 3.5  3.5 - 5.1 (mEq/L)   Chloride 106  96 - 112 (mEq/L)   CO2 25  19 - 32 (mEq/L)   Glucose, Bld 110 (*) 70 - 99 (mg/dL)   BUN 7  6 - 23 (mg/dL)   Creatinine, Ser 4.09  0.50 - 1.10 (mg/dL)   Calcium 9.3  8.4 - 81.1 (mg/dL)   Total Protein 7.2  6.0 - 8.3 (g/dL)   Albumin 4.0  3.5 - 5.2 (g/dL)   AST 18  0 - 37 (U/L)   ALT <5  0 - 35 (U/L)   Alkaline Phosphatase 41  39 - 117 (U/L)   Total Bilirubin 1.6 (*) 0.3 - 1.2 (mg/dL)   GFR calc non Af Amer >90  >90 (mL/min)   GFR calc Af Amer >90  >90 (mL/min)  LIPASE, BLOOD      Component Value Range   Lipase 36  11 - 59 (U/L)   Laboratory interpretation all normal except stable mild anemia, stable chronically mildly elevated TB  Ct Abdomen Pelvis W Contrast  06/23/2011  *RADIOLOGY REPORT*  Clinical Data: Abdominal pain, nausea and emesis  CT ABDOMEN AND PELVIS WITH CONTRAST  Technique:  Multidetector CT  imaging of the abdomen and pelvis was performed following the standard protocol during bolus administration of intravenous contrast.  Contrast: OMNIPAQUE IOHEXOL 300 MG/ML  SOLN  Comparison: 03/25/2010  Findings: The lung bases appear clear.  In the left base there is a small nodule measuring 3.9 mm, image #9.  Unchanged from 2008 and most likely benign.  No focal liver abnormality.  Prior cholecystectomy.  Mild increased caliber of the common bile duct which measures 6.8 mm.  This is similar to previous exam. Pancreatic duct is normal.  The pancreas has a normal appearance The spleen is negative.  Both adrenal glands are negative.  The left kidney has a normal appearance.  Small hypodense structure within the posterior cortex of the right kidney is too small to characterize measuring 5.7 mm, image 35.  There is no upper abdominal adenopathy.  There is no pelvic or inguinal adenopathy.  Trace amount of free fluid is in the pelvis.  The uterus and adnexal structures have a normal physiologic appearance for the patient's age.  The stomach appears normal.  The small bowel loops are normal in caliber.  The appendix is difficult to visualize separate from the right lower quadrant bowel loops.  No secondary signs of appendicitis. The colon is normal.  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  IMPRESSION:  1.  No acute findings identified within the abdomen or pelvis. 2.  Similar caliber of common bile duct which measures up to 6.8 mm.  No intrahepatic biliary dilatation noted. 3.  Pulmonary nodule in the left base is unchanged from 2008 and is most likely benign.  Original Report Authenticated By: Rosealee Albee, M.D.     1. Nausea   2. Diarrhea    New Prescriptions   PROMETHAZINE (PHENERGAN) 25 MG SUPPOSITORY    Place 1 suppository (25 mg total) rectally every 6 (six) hours as needed for nausea.    Plan discharge Devoria Albe, MD,  FACEP    MDM  I personally performed the services described in this documentation, which was scribed in my presence. The recorded information has been reviewed and considered. Devoria Albe, MD, FACEP          [name], MD 06/29/11 1011

## 2011-06-29 NOTE — ED Notes (Signed)
Pt c/o n/diarrhea/weakness. Symptoms x 2 weeks, worsening over the past 2 days. Pt seen in ed last week and dx with gastritis.

## 2011-06-29 NOTE — Discharge Instructions (Signed)
Drink plenty of fluids. Use the phenergan for nausea. Keep your appointment at [facility] on the 23rd to further investigate your chronic nausea and abdominal pain.  Your abdominal CT scan on the 26th did not show a mass or tumor on your pancreas. Avoid mild products until the diarrhea is gone.  Talk to Dr Drue Novel about your anxiety, he may want to change the dose of your celexa or try something else. Recheck as needed.

## 2011-06-29 NOTE — ED Notes (Signed)
Checked on patient. Patient states "I just feel so weird"

## 2011-06-29 NOTE — ED Notes (Signed)
Patient with no complaints at this time. Respirations even and unlabored. Skin warm/dry. Discharge instructions reviewed with patient at this time. Patient given opportunity to voice concerns/ask questions. IV removed per policy and band-aid applied to site. Patient discharged at this time and left Emergency Department with steady gait.  

## 2011-06-29 NOTE — ED Notes (Signed)
Dr.Knapp at bedside  

## 2011-07-02 ENCOUNTER — Telehealth: Payer: Self-pay | Admitting: Internal Medicine

## 2011-07-02 NOTE — Telephone Encounter (Signed)
Please call pt, set up a OV to f/u 2 recent ER visits

## 2011-07-03 NOTE — Telephone Encounter (Signed)
Pt is coming in next week for post ED follow-up. Patients states she has been doubling up on her celexa and taking 20mg  instead of 10mg . She would like to know if that's okay and she will need a refill early. Please advise.

## 2011-07-03 NOTE — Telephone Encounter (Signed)
Ok to increase to 20 mg, OK RF #30 if needed

## 2011-07-03 NOTE — Telephone Encounter (Signed)
Please advise 

## 2011-07-04 NOTE — Telephone Encounter (Signed)
LMOVM for pt to return call 

## 2011-07-05 ENCOUNTER — Telehealth: Payer: Self-pay | Admitting: Internal Medicine

## 2011-07-05 NOTE — Telephone Encounter (Signed)
Patient is scheduled for 5.15.13 at 4pm, however she states she is feeling extremely bad and would like to come int today or tomorrow. Unfortunately the schedule does not allow that unless I use the 2-day day appointments tomorrow. Please review & advise Patent PH# [phone]

## 2011-07-05 NOTE — Telephone Encounter (Signed)
Pt is schedule at 1115am on 07/06/11.

## 2011-07-06 ENCOUNTER — Encounter: Payer: Self-pay | Admitting: Internal Medicine

## 2011-07-06 ENCOUNTER — Ambulatory Visit (INDEPENDENT_AMBULATORY_CARE_PROVIDER_SITE_OTHER): Payer: BC Managed Care – PPO | Admitting: Internal Medicine

## 2011-07-06 VITALS — BP 108/66 | HR 111 | Temp 98.5°F | Wt 135.0 lb

## 2011-07-06 DIAGNOSIS — K219 Gastro-esophageal reflux disease without esophagitis: Secondary | ICD-10-CM

## 2011-07-06 DIAGNOSIS — D509 Iron deficiency anemia, unspecified: Secondary | ICD-10-CM

## 2011-07-06 DIAGNOSIS — R197 Diarrhea, unspecified: Secondary | ICD-10-CM

## 2011-07-06 DIAGNOSIS — F411 Generalized anxiety disorder: Secondary | ICD-10-CM

## 2011-07-06 LAB — HEMOGLOBIN: Hemoglobin: 12.1 g/dL (ref 12.0–15.0)

## 2011-07-06 MED ORDER — PANTOPRAZOLE SODIUM 40 MG PO TBEC: 40.0000 mg | DELAYED_RELEASE_TABLET | Freq: Every day | ORAL | Status: DC

## 2011-07-06 MED ORDER — ZOLPIDEM TARTRATE 10 MG PO TABS: 10.0000 mg | ORAL_TABLET | Freq: Every evening | ORAL | Status: DC | PRN

## 2011-07-06 NOTE — Progress Notes (Signed)
  Subjective:    Patient ID: Donna Lucas, female    DOB: January 08, 1971, 41 y.o.   MRN: [id number]  HPI Hospital f/u, here with her husband: The patient was seen twice at the ER 06/23/2011 on 06/29/2011. She was seen mostly d/t  diarrhea. ER records reviewed, labs and abdominal CT were okay except for hemoglobin of 11.9. She was prescribed Pepcid. Since then, she continue with diarrhea, also has upper stomach discomfort. Her main concern today is anxiety, was previously well controlled with citalopram 20 mg, she was doing so well that she decrease it gradually to 5 mg daily. Her anxiety is a lot worse, she is working 2 jobs and going back to school. Husband works outside of town most days of the week. In addition, now with all her GI symptoms, she thinks that she is has cancer, "they just has not found it yet". She's not eating well, yesterday at school she almost passed out, she thinks from the cough eating.  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 Stools are described as watery-loose-sometimes hard. No blood in the stools, color can be green or pale. In the last 2 weeks she also has developed heartburn, described as burning in the chest, sometimes severe. As far as the anemia, she has chronic anemia, she has periods every 3-4 weeks, usually the first 2 days are really heavy.     Objective:   Physical Exam General -- alert, well-developed, +  emotional distress.  Neck --no LADs Lungs -- normal respiratory effort, no intercostal retractions, no accessory muscle use, and normal breath sounds.   Heart-- normal rate, regular rhythm, no murmur, and no gallop.   Abdomen--soft, non-tender, no distention, no masses, no HSM, no guarding, and no rigidity.   Extremities-- no pretibial edema bilaterally  Neurologic-- alert & oriented X3 Psych-- patient is very anxious, crying at times, does not look depressed.     Assessment & Plan:  Today , I spent more than 30  min with the patient, >50% of the time counseling, and /or reviewing the chart and labs ordered by other providers

## 2011-07-06 NOTE — Assessment & Plan Note (Addendum)
I think the patient's main problem is anxiety. She has chronic anxiety, previously well-controlled with citalopram 20 mg, she gradually taper it down to 5 mg daily. Anxiety at this point is triggered  by working 2 jobs and also by GI symptoms---> her GI  symptoms definitely feed the anxiety as she thinks she has cancer somewhere. Counseled, she felt great relief when I told her I do not think she has any sort of  cancer. Rec to go back to citalopram 20 mg qd  Needs something for insomnia, prescribe Ambien.

## 2011-07-06 NOTE — Assessment & Plan Note (Signed)
Describe severe symptoms for the last 3 weeks. Start PPIs

## 2011-07-06 NOTE — Assessment & Plan Note (Signed)
Long history of anemia, will check labs. Anemia most likely due to heavy periods, she never had a colonoscopy to my knowledge, a endoscopy is a consideration. She has an appointment to see her GI,

## 2011-07-06 NOTE — Assessment & Plan Note (Signed)
3 weeks history of diarrhea without red flag symptoms. IBS? Plan: Check a TSH, check a C diff . Followup with GI. Treat anxiety.

## 2011-07-06 NOTE — Patient Instructions (Signed)
Rest, drink plenty of fluids, go back to  citalopram 20 mg daily. Use Ambien as needed for insomnia. Follow up with your GI doctor Come back in 3 weeks.

## 2011-07-07 ENCOUNTER — Telehealth: Payer: Self-pay | Admitting: Internal Medicine

## 2011-07-07 ENCOUNTER — Other Ambulatory Visit: Payer: Self-pay | Admitting: Internal Medicine

## 2011-07-07 MED ORDER — ALPRAZOLAM 0.5 MG PO TABS: 0.5000 mg | ORAL_TABLET | Freq: Four times a day (QID) | ORAL | Status: DC | PRN

## 2011-07-07 NOTE — Progress Notes (Signed)
Addended by: Edwena Felty T on: 07/07/2011 08:52 AM   Modules accepted: Orders

## 2011-07-07 NOTE — Telephone Encounter (Signed)
Spoke with pt & explained that her results will not be back until Monday. She is very emotional. Pt states she took her celexa but it is still not helping. Is there anything else that can be prescribed. Please advise.

## 2011-07-07 NOTE — Telephone Encounter (Signed)
Discussed with pt, sent in rx.  

## 2011-07-07 NOTE — Telephone Encounter (Signed)
Patient called and has questions regarding stool sample, test results & still very nervous Please call at [phone]

## 2011-07-07 NOTE — Telephone Encounter (Signed)
Please advise 

## 2011-07-07 NOTE — Telephone Encounter (Signed)
Recommend alprazolam 0.5 mg one by mouth 4 times a day when necessary anxiety. #40, no refills. Remind patient she cannot take this medication if pregnant and it will cause drowsiness. It will take a few more days for the citalopram to "kick in" thus alprazolam is only temporary

## 2011-07-08 LAB — CLOSTRIDIUM DIFFICILE EIA: CDIFTX: NEGATIVE

## 2011-07-10 ENCOUNTER — Telehealth: Payer: Self-pay | Admitting: Internal Medicine

## 2011-07-10 ENCOUNTER — Ambulatory Visit (INDEPENDENT_AMBULATORY_CARE_PROVIDER_SITE_OTHER): Payer: BC Managed Care – PPO | Admitting: Internal Medicine

## 2011-07-10 ENCOUNTER — Encounter: Payer: Self-pay | Admitting: Internal Medicine

## 2011-07-10 ENCOUNTER — Encounter (HOSPITAL_COMMUNITY): Payer: Self-pay

## 2011-07-10 ENCOUNTER — Emergency Department (HOSPITAL_COMMUNITY)
Admission: EM | Admit: 2011-07-10 | Discharge: 2011-07-10 | Disposition: A | Discharge status: Home / Self Care | Payer: BC Managed Care – PPO | Attending: Emergency Medicine | Admitting: Emergency Medicine

## 2011-07-10 VITALS — BP 110/76 | HR 116 | Temp 98.1°F | Wt 133.0 lb

## 2011-07-10 DIAGNOSIS — R197 Diarrhea, unspecified: Secondary | ICD-10-CM | POA: Insufficient documentation

## 2011-07-10 DIAGNOSIS — R112 Nausea with vomiting, unspecified: Secondary | ICD-10-CM | POA: Insufficient documentation

## 2011-07-10 DIAGNOSIS — R45851 Suicidal ideations: Secondary | ICD-10-CM | POA: Insufficient documentation

## 2011-07-10 DIAGNOSIS — Z87891 Personal history of nicotine dependence: Secondary | ICD-10-CM | POA: Insufficient documentation

## 2011-07-10 DIAGNOSIS — F410 Panic disorder [episodic paroxysmal anxiety] without agoraphobia: Secondary | ICD-10-CM | POA: Insufficient documentation

## 2011-07-10 DIAGNOSIS — F411 Generalized anxiety disorder: Secondary | ICD-10-CM

## 2011-07-10 DIAGNOSIS — F419 Anxiety disorder, unspecified: Secondary | ICD-10-CM

## 2011-07-10 HISTORY — DX: Anxiety disorder, unspecified: F41.9

## 2011-07-10 LAB — PREGNANCY, URINE: Preg Test, Ur: NEGATIVE

## 2011-07-10 LAB — CBC
MCH: 28.3 pg (ref 26.0–34.0)
MCHC: 33.5 g/dL (ref 30.0–36.0)
MCV: 84.3 fL (ref 78.0–100.0)
Platelets: 271 10*3/uL (ref 150–400)
RBC: 4.21 MIL/uL (ref 3.87–5.11)

## 2011-07-10 LAB — URINALYSIS, ROUTINE W REFLEX MICROSCOPIC
Leukocytes, UA: NEGATIVE
Protein, ur: NEGATIVE mg/dL
Urobilinogen, UA: 0.2 mg/dL (ref 0.0–1.0)

## 2011-07-10 LAB — COMPREHENSIVE METABOLIC PANEL
BUN: 10 mg/dL (ref 6–23)
Calcium: 9.1 mg/dL (ref 8.4–10.5)
GFR calc Af Amer: >90 mL/min (ref >90)
Glucose, Bld: 102 mg/dL — ABNORMAL HIGH (ref 70–99)
Sodium: 136 mEq/L (ref 135–145)
Total Protein: 7 g/dL (ref 6.0–8.3)

## 2011-07-10 LAB — ACETAMINOPHEN LEVEL: Acetaminophen (Tylenol), Serum: <15 ug/mL (ref 10–30)

## 2011-07-10 LAB — DIFFERENTIAL
Eosinophils Absolute: 0.1 10*3/uL (ref 0.0–0.7)
Eosinophils Relative: 1 % (ref 0–5)
Lymphs Abs: 2.5 10*3/uL (ref 0.7–4.0)
Monocytes Relative: 10 % (ref 3–12)

## 2011-07-10 LAB — ETHANOL: Alcohol, Ethyl (B): <11 mg/dL (ref 0–11)

## 2011-07-10 LAB — URINE MICROSCOPIC-ADD ON

## 2011-07-10 MED ORDER — CITALOPRAM HYDROBROMIDE 10 MG PO TABS
10.0000 mg | ORAL_TABLET | Freq: Every day | ORAL | Status: DC
  Administered 2011-07-10: 10 mg via ORAL
  Filled 2011-07-10: qty 1

## 2011-07-10 MED ORDER — ZOLPIDEM TARTRATE 5 MG PO TABS: 5.0000 mg | ORAL_TABLET | Freq: Every evening | ORAL | Status: DC | PRN

## 2011-07-10 MED ORDER — ALPRAZOLAM 0.5 MG PO TABS
0.5000 mg | ORAL_TABLET | Freq: Four times a day (QID) | ORAL | Status: DC | PRN
  Administered 2011-07-10: 0.5 mg via ORAL
  Filled 2011-07-10: qty 1

## 2011-07-10 MED ORDER — DICYCLOMINE HCL 10 MG PO CAPS
10.0000 mg | ORAL_CAPSULE | Freq: Three times a day (TID) | ORAL | Status: DC
  Administered 2011-07-10 (×2): 10 mg via ORAL
  Filled 2011-07-10 (×2): qty 1

## 2011-07-10 MED ORDER — LORAZEPAM 1 MG PO TABS: 1.0000 mg | ORAL_TABLET | Freq: Three times a day (TID) | ORAL | Status: DC | PRN

## 2011-07-10 MED ORDER — ACETAMINOPHEN 325 MG PO TABS: 650.0000 mg | ORAL_TABLET | ORAL | Status: DC | PRN

## 2011-07-10 MED ORDER — IBUPROFEN 600 MG PO TABS: 600.0000 mg | ORAL_TABLET | Freq: Three times a day (TID) | ORAL | Status: DC | PRN

## 2011-07-10 MED ORDER — PANTOPRAZOLE SODIUM 40 MG PO TBEC
40.0000 mg | DELAYED_RELEASE_TABLET | Freq: Every day | ORAL | Status: DC
  Filled 2011-07-10: qty 1

## 2011-07-10 MED ORDER — ONDANSETRON HCL 4 MG PO TABS: 4.0000 mg | ORAL_TABLET | Freq: Three times a day (TID) | ORAL | Status: DC | PRN

## 2011-07-10 NOTE — Assessment & Plan Note (Signed)
uncontrolled anxiety, needs immediate help. She did mention suicidal ideas during the weekend. Case discussed with behavioral, they recommend to go to the ER. Both the patient and her husband are in agreement to go.

## 2011-07-10 NOTE — ED Notes (Signed)
Pt presents to unit seeking help for anxiety. States she has been on her meds (Celexa and Xanax) and that they are not working for her anymore. Pt states she has panic attacks and is not sure how to go forward until she feels better. Pt is alert and oriented, pleasant towards staff. Oriented to unit, shown to the bathroom, denies further questions.

## 2011-07-10 NOTE — ED Provider Notes (Signed)
History     CSN: [id number]  Arrival date & time 07/10/11  1644   First MD Initiated Contact with Patient 07/10/11 1831      Chief Complaint  Patient presents with  . Anxiety    (Consider location/radiation/quality/duration/timing/severity/associated sxs/prior treatment) Patient is a 41 y.o. female presenting with anxiety. The history is provided by the patient.  Anxiety This is a new problem. The current episode started 1 to 4 weeks ago. The problem occurs 2 to 4 times per day. The problem has been gradually worsening. Associated symptoms include nausea and vomiting. Pertinent negatives include no abdominal pain, chills or fever.  Pt states increased anxiety over the last 3 weeks. States having painic attacks several times a day, unable to function. States keeps thinking she is dying. Has not been working last week  Due to this. Was seen by her doctor, admitted that she cannot deal with this anymore, and would rather die than live like this. Was sent here. Pt states she has had anxiety all her life, but never this bad.  Past Medical History  Diagnosis Date  . Anemia   . Elevated LFTs     after cholecystectomy  . Abnormal CT scan, chest 02-2009    solitary nodule,f/u by pulmonology, neck CT, April 2011  . Anxiety     Past Surgical History  Procedure Date  . Ovarian cyst removal   . Cholecystectomy   . Ectopic pregnancy surgery   . Ercp     2 yrs ago after her GB removed    Family History  Problem Relation Age of Onset  . Cancer Other     ovarian; breast  . Healthy Son   . Healthy Son   . Healthy Daughter     History  Substance Use Topics  . Smoking status: Former Smoker    Types: Cigarettes    Quit date: 05/08/1991  . Smokeless tobacco: Never Used   Comment: greater than 20 yrs ago ,patient smokes less than 1/2 pack a day  . Alcohol Use: No    OB History    Grav Para Term Preterm Abortions TAB SAB Ect Mult Living                  Review of Systems    Constitutional: Negative for fever and chills.  Respiratory: Negative.   Cardiovascular: Negative.   Gastrointestinal: Positive for nausea, vomiting and diarrhea. Negative for abdominal pain.  Genitourinary: Negative for dysuria and flank pain.  Musculoskeletal: Negative.   Psychiatric/Behavioral: Positive for suicidal ideas. The patient is nervous/anxious.     Allergies  Review of patient's allergies indicates no known allergies.  Home Medications   Current Outpatient Rx  Name Route Sig Dispense Refill  . ALPRAZOLAM 0.5 MG PO TABS Oral Take 1 tablet (0.5 mg total) by mouth 4 (four) times daily as needed for sleep. 40 tablet 0  . CITALOPRAM HYDROBROMIDE 20 MG PO TABS Oral Take 0.5 tablets (10 mg total) by mouth daily. 30 tablet 8  . DICYCLOMINE HCL 10 MG PO CAPS Oral Take 10 mg by mouth 4 (four) times daily -  before meals and at bedtime.    Marland Kitchen DIPHENHYDRAMINE HCL 25 MG PO TABS Oral Take 25 mg by mouth every 6 (six) hours as needed. FOR ALLERGIES    . PANTOPRAZOLE SODIUM 40 MG PO TBEC Oral Take 1 tablet (40 mg total) by mouth daily. 30 tablet 3  . PROMETHAZINE HCL 25 MG RE SUPP Rectal Place 1  suppository (25 mg total) rectally every 6 (six) hours as needed for nausea. 12 each 0    BP 109/73  Pulse 93  Temp(Src) 98.7 F (37.1 C) (Oral)  Resp 20  SpO2 100%  LMP 06/29/2011  Physical Exam  Nursing note and vitals reviewed. Constitutional: She is oriented to person, place, and time. She appears well-developed and well-nourished.       Anxious appearing, tearful  HENT:  Head: Normocephalic and atraumatic.  Eyes: Conjunctivae are normal.  Neck: Neck supple.  Cardiovascular: Normal rate, regular rhythm and normal heart sounds.   Pulmonary/Chest: Effort normal and breath sounds normal. No respiratory distress.  Abdominal: Soft. Bowel sounds are normal. There is no tenderness.  Neurological: She is alert and oriented to person, place, and time.  Skin: Skin is warm and dry.   Psychiatric:       anxous    ED Course  Procedures (including critical care time)  Pt with increased anxiety, multiple panic a day, unable to function. Pt states she feels like she is dying. Also verbalizes thought of harming herself if this continues. Will get ACT to assess after medial clearance.   Results for orders placed during the hospital encounter of 07/10/11  CBC      Component Value Range   WBC 7.4  4.0 - 10.5 (K/uL)   RBC 4.21  3.87 - 5.11 (MIL/uL)   Hemoglobin 11.9 (*) 12.0 - 15.0 (g/dL)   HCT 57.8 (*) 46.9 - 46.0 (%)   MCV 84.3  78.0 - 100.0 (fL)   MCH 28.3  26.0 - 34.0 (pg)   MCHC 33.5  30.0 - 36.0 (g/dL)   RDW 62.9  52.8 - 41.3 (%)   Platelets 271  150 - 400 (K/uL)  DIFFERENTIAL      Component Value Range   Neutrophils Relative 55  43 - 77 (%)   Neutro Abs 4.1  1.7 - 7.7 (K/uL)   Lymphocytes Relative 34  12 - 46 (%)   Lymphs Abs 2.5  0.7 - 4.0 (K/uL)   Monocytes Relative 10  3 - 12 (%)   Monocytes Absolute 0.7  0.1 - 1.0 (K/uL)   Eosinophils Relative 1  0 - 5 (%)   Eosinophils Absolute 0.1  0.0 - 0.7 (K/uL)   Basophils Relative 1  0 - 1 (%)   Basophils Absolute 0.0  0.0 - 0.1 (K/uL)  COMPREHENSIVE METABOLIC PANEL      Component Value Range   Sodium 136  135 - 145 (mEq/L)   Potassium 3.3 (*) 3.5 - 5.1 (mEq/L)   Chloride 101  96 - 112 (mEq/L)   CO2 24  19 - 32 (mEq/L)   Glucose, Bld 102 (*) 70 - 99 (mg/dL)   BUN 10  6 - 23 (mg/dL)   Creatinine, Ser 2.44  0.50 - 1.10 (mg/dL)   Calcium 9.1  8.4 - 01.0 (mg/dL)   Total Protein 7.0  6.0 - 8.3 (g/dL)   Albumin 4.2  3.5 - 5.2 (g/dL)   AST 14  0 - 37 (U/L)   ALT <5  0 - 35 (U/L)   Alkaline Phosphatase 40  39 - 117 (U/L)   Total Bilirubin 1.0  0.3 - 1.2 (mg/dL)   GFR calc non Af Amer >90  >90 (mL/min)   GFR calc Af Amer >90  >90 (mL/min)  ETHANOL      Component Value Range   Alcohol, Ethyl (B) <11  0 - 11 (mg/dL)  PREGNANCY, URINE  Component Value Range   Preg Test, Ur NEGATIVE  NEGATIVE    URINALYSIS, ROUTINE W REFLEX MICROSCOPIC      Component Value Range   Color, Urine YELLOW  YELLOW    APPearance CLEAR  CLEAR    Specific Gravity, Urine 1.031 (*) 1.005 - 1.030    pH 6.0  5.0 - 8.0    Glucose, UA NEGATIVE  NEGATIVE (mg/dL)   Hgb urine dipstick TRACE (*) NEGATIVE    Bilirubin Urine NEGATIVE  NEGATIVE    Ketones, ur 40 (*) NEGATIVE (mg/dL)   Protein, ur NEGATIVE  NEGATIVE (mg/dL)   Urobilinogen, UA 0.2  0.0 - 1.0 (mg/dL)   Nitrite NEGATIVE  NEGATIVE    Leukocytes, UA NEGATIVE  NEGATIVE   ACETAMINOPHEN LEVEL      Component Value Range   Acetaminophen (Tylenol), Serum <15.0  10 - 30 (ug/mL)  URINE MICROSCOPIC-ADD ON      Component Value Range   Squamous Epithelial / LPF MANY (*) RARE    WBC, UA 0-2  <3 (WBC/hpf)   RBC / HPF 7-10  <3 (RBC/hpf)   Bacteria, UA MANY (*) RARE    Crystals TRIPLE PHOSPHATE CRYSTALS (*) NEGATIVE    Urine-Other MUCOUS PRESENT       I spoke with [facility] ,  they will assess.   Pt apparently discharged by Dr. Jeraldine Loots per her request for outpatient follow up.    1. Anxiety       MDM          Lottie Mussel, PA 07/11/11 0110

## 2011-07-10 NOTE — ED Notes (Signed)
Pt. Changed into blue scrubs and wanded by security.

## 2011-07-10 NOTE — ED Notes (Signed)
Pt discharged with outpatient referrals. Leaves unit with husband in safe and stable condition.

## 2011-07-10 NOTE — Telephone Encounter (Signed)
Made appt wt/patient as per your request that she comes in today at 3:45pm

## 2011-07-10 NOTE — Patient Instructions (Signed)
I already talked with [facility], they recommend me to send you to the emergency room for further evaluation.

## 2011-07-10 NOTE — Discharge Instructions (Signed)

## 2011-07-10 NOTE — Progress Notes (Signed)
  Subjective:    Patient ID: Donna Lucas, female    DOB: Sep 10, 1970, 41 y.o.   MRN: [id number]  HPI Acute visit, here with her husband. She was seen last week with severe anxiety, she's not better, actually is worse. She feels constantly anxious, she thinks is going to die . Ambien  help her to fall sleep but she wakes up with palpitations several times a night. She mentioned this weekend more than once that she doesn't like to live this way. Husband got really concerned and thought about taking her to the ER but  didn't.  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     Assessment & Plan:

## 2011-07-10 NOTE — ED Provider Notes (Signed)
8:54 PM Patient would like to leave.  She prefers to continue her eval as an outpatient.  The patient's husband notes that this is ok.  [facility] will provide information on outpatient resources.   Gerhard Munch, MD 07/10/11 2055

## 2011-07-10 NOTE — ED Notes (Signed)
Pt belongings took to  The car by pt

## 2011-07-10 NOTE — Telephone Encounter (Signed)
Please call pt & see if she can come in today at 345pm.

## 2011-07-10 NOTE — Telephone Encounter (Signed)
Anxiety is worse needs something today if possible Ph [phone] please call tommy as soon as possible He states his wife is getting worse instead of better and he must have her seen today

## 2011-07-10 NOTE — ED Notes (Signed)
Pt and husband asking to leave. Pt states she will be unable to stay here without her husband after 9pm. Pt states that she is scared to be here and that another pt told her to "run while she could" when she arrived. Despite RN's attempts to reassure pt, she is requesting discharge. Outpatient follow-up referrals given to pt's husband. Pt continues to deny SI/HI and husband agrees that her safety is not at risk.

## 2011-07-10 NOTE — ED Notes (Signed)
Pt. States she's been having increased anxiety these past 3 weeks. Currently taking celexa and xanax without relief. States she has multiple panic attacks a day and stated that "I'd rather die than live like this".

## 2011-07-11 ENCOUNTER — Telehealth: Payer: Self-pay | Admitting: Internal Medicine

## 2011-07-11 DIAGNOSIS — F419 Anxiety disorder, unspecified: Secondary | ICD-10-CM

## 2011-07-11 NOTE — Telephone Encounter (Signed)
Please advise 

## 2011-07-11 NOTE — ED Provider Notes (Signed)
Medical screening examination/treatment/procedure(s) were performed by non-physician practitioner and as supervising physician I was immediately available for consultation/collaboration.  Cyleigh Massaro, MD 07/11/11 1840 

## 2011-07-11 NOTE — Telephone Encounter (Signed)
patient states she went to [facility] mental health was there all night, they sent her home with a list of psychiatrist. However all listed have a 6-week wait time and she is having severe anxiety Please call as soon as you can -per patient request [phone]

## 2011-07-11 NOTE — Telephone Encounter (Signed)
Noami can you please set up an appt.

## 2011-07-11 NOTE — Telephone Encounter (Signed)
Discussed with pt

## 2011-07-11 NOTE — Telephone Encounter (Signed)
Patient called back again and is really stressing out, can you please call her back? Ph# [phone]

## 2011-07-11 NOTE — Telephone Encounter (Signed)
She needs to stay home this week. Send a note if needed. She does not have stomach cancer, further advise per GI in reference to her stomach problems

## 2011-07-11 NOTE — Telephone Encounter (Signed)
Please arrange a referral to one of the psychiatrists in [place] or [place]---->  call their office, see which one could see her earlier, if I need to talk to them I will.

## 2011-07-11 NOTE — Telephone Encounter (Signed)
Does pt need to wait the 6 weeks or is there another option? Please advise.

## 2011-07-11 NOTE — Telephone Encounter (Signed)
I have referred patient for an ASAP appointment to [facility] in [place], they are normally very quick about getting patients an appointment, and they will call her themselves to get her scheduled.  I called to inform patient of above, she understands but now has additional questions.  Patient asking when Dr. Drue Novel would return her to work.  Also, she asked me "do i have stomach cancer?".  I asked patient why would she think something like that, and she states because she is unable to eat without getting nausea, and her stomach feels jittery.  Was only able to get down 1 banana, and 1 Boost shake.  Please advise.

## 2011-07-12 ENCOUNTER — Ambulatory Visit: Payer: BC Managed Care – PPO | Admitting: Internal Medicine

## 2011-07-12 ENCOUNTER — Telehealth: Payer: Self-pay | Admitting: *Deleted

## 2011-07-12 MED ORDER — SERTRALINE HCL 50 MG PO TABS: ORAL_TABLET | ORAL | Status: DC

## 2011-07-12 NOTE — Telephone Encounter (Signed)
Discontinue citalopram, start sertraline 50 mg --One tablet daily for 5 days --1.5 tablet daily for 5 days --2 tablets daily thereafter. #60 no refills Office visit 4 weeks, call if side effects

## 2011-07-12 NOTE — Telephone Encounter (Signed)
A counselor from [employer] called & stated that after talking with the pt he no longer thinks Celexa is doing the job. The NP recommends Zoloft 50mg  & if tollerated to increase to 100mg . Please advise.

## 2011-07-12 NOTE — Telephone Encounter (Signed)
Discussed with pt & sent in prescription.

## 2011-07-13 ENCOUNTER — Ambulatory Visit: Payer: BC Managed Care – PPO | Admitting: Internal Medicine

## 2011-07-14 ENCOUNTER — Encounter: Payer: Self-pay | Admitting: *Deleted

## 2011-07-14 ENCOUNTER — Telehealth: Payer: Self-pay | Admitting: Internal Medicine

## 2011-07-14 NOTE — Telephone Encounter (Signed)
Needs note releasing her to go back to work 5.22.13, can be faxed direct to her work fax# [phone], Also call patient [phone]

## 2011-07-14 NOTE — Telephone Encounter (Signed)
Okay to release her to go back to work as soon as she is able to.

## 2011-07-14 NOTE — Telephone Encounter (Signed)
Please advise 

## 2011-07-17 ENCOUNTER — Telehealth: Payer: Self-pay | Admitting: Internal Medicine

## 2011-07-17 NOTE — Telephone Encounter (Signed)
Please call patient as soon as possible she is having panic attacks again, could not go to work and has no idea what to do [phone]

## 2011-07-17 NOTE — Telephone Encounter (Signed)
Noted  

## 2011-07-17 NOTE — Telephone Encounter (Signed)
We just switched her SSRIs, I don't think I would do anything different. Evidently will need to extend her sick leave. Needs to be seen by psych

## 2011-07-17 NOTE — Telephone Encounter (Signed)
Spoke with pt, she states that she has been waking up in the middle of the night with her heart racing & a burning sensation. She went into work for an hour this morning but couldn't stay any longer. I offered her an appt for this afternoon but she wanted to know if you thought she should come in or not. Please advise.

## 2011-07-17 NOTE — Telephone Encounter (Signed)
Discussed with pt. She saw psych last Thursday & is going again on Friday.

## 2011-07-21 ENCOUNTER — Encounter: Payer: Self-pay | Admitting: *Deleted

## 2011-07-21 ENCOUNTER — Telehealth: Payer: Self-pay | Admitting: Internal Medicine

## 2011-07-21 NOTE — Telephone Encounter (Signed)
error 

## 2011-07-25 ENCOUNTER — Telehealth: Payer: Self-pay | Admitting: Internal Medicine

## 2011-07-25 ENCOUNTER — Telehealth (INDEPENDENT_AMBULATORY_CARE_PROVIDER_SITE_OTHER): Payer: Self-pay | Admitting: *Deleted

## 2011-07-25 DIAGNOSIS — R1013 Epigastric pain: Secondary | ICD-10-CM

## 2011-07-25 DIAGNOSIS — R110 Nausea: Secondary | ICD-10-CM

## 2011-07-25 NOTE — Telephone Encounter (Signed)
Patient is wanting Dr. Karilyn Cota to give her a return call today. I have also sent a telephone message she left for Terri to give her a call this morning. The return phone number is [phone].

## 2011-07-25 NOTE — Telephone Encounter (Signed)
Donna Lucas called and states that she has had Diarrhea for 1 month. She averaged 3-4 times a day. She has had a 10 pound weight loss in 1 month. She is also having Epigastric burning, and states that when she eats she becomes so nauseated. She feel that it is Gastritis. I ask about her current medications , she is not taking Bentyl , was not sure if she was to continue it and she saw her PCP who told her she had Acid Reflux , IBS , and when she ask about if she needed to take it ,she was told she did not. The  Protonix  she does not feel that this is working. She would appreciate a call back at [phone]

## 2011-07-25 NOTE — Telephone Encounter (Signed)
Caller: Yumiko/Patient; PCP: Willow Ora; CB#: [phone]; ; ; Call regarding Diarrhea Medication Is Not Working;   Pt had OV ~ 2 weeks ago  for diarrhea  up to 3-4 loose stools daily  that had been present  x 2-3 weeks. Dr Drue Novel  gave   Protonix but doesnt help.  Feels like everything goes through her. Has been having continous abd pain rating 4/10  with   intermittent  episodes of worsening pain up to 7/10 that affects activities .Pt saw  a GI specialist -- Dr Hessie Diener / [facility] last week  ~ 07/17/2011,   and a Endocopic [place] is going to be scheduled. Pt has called  her GI office 3 times today with  no reply. RN reached ED DISP for severe pain that is infereing with normal activities per Diarrhea or Other Change in Bowel Habits protocol . There are no EPIC appts for  today and  pt is 1 1/2 hr away from office -- office closes in less than 1 hr. RN adv to proceed to [facility] ED now for evaluation. Pt declined and stated she will wait to see if GI calls  Back,and manage symptoms at home,  but also requested msg to be sent to Dr Drue Novel

## 2011-07-25 NOTE — Telephone Encounter (Addendum)
Per Dr.Rehnan the patient should resume Bentyl (Dicyclomine) take 1 by mouth three times a day prior to meals May change PPI to Lansoprazole 30 mg Twice a day She needs to come to office for a weight check  She also needs LFT's drawn Patient called and made aware PPI called to patients pharmacy [facility]  [phone] Lab order faxed to [facility]

## 2011-07-25 NOTE — Telephone Encounter (Signed)
She really needs to d/w GI

## 2011-07-26 ENCOUNTER — Other Ambulatory Visit (INDEPENDENT_AMBULATORY_CARE_PROVIDER_SITE_OTHER): Payer: Self-pay | Admitting: Internal Medicine

## 2011-07-26 ENCOUNTER — Encounter (INDEPENDENT_AMBULATORY_CARE_PROVIDER_SITE_OTHER): Payer: Self-pay | Admitting: Internal Medicine

## 2011-07-26 ENCOUNTER — Telehealth (INDEPENDENT_AMBULATORY_CARE_PROVIDER_SITE_OTHER): Payer: Self-pay | Admitting: *Deleted

## 2011-07-26 ENCOUNTER — Ambulatory Visit (INDEPENDENT_AMBULATORY_CARE_PROVIDER_SITE_OTHER): Payer: BC Managed Care – PPO | Admitting: Internal Medicine

## 2011-07-26 VITALS — BP 102/68 | HR 72 | Temp 98.6°F | Resp 20 | Ht 64.0 in | Wt 128.0 lb

## 2011-07-26 DIAGNOSIS — R1013 Epigastric pain: Secondary | ICD-10-CM

## 2011-07-26 DIAGNOSIS — R634 Abnormal weight loss: Secondary | ICD-10-CM

## 2011-07-26 DIAGNOSIS — R197 Diarrhea, unspecified: Secondary | ICD-10-CM

## 2011-07-26 DIAGNOSIS — K219 Gastro-esophageal reflux disease without esophagitis: Secondary | ICD-10-CM

## 2011-07-26 DIAGNOSIS — R112 Nausea with vomiting, unspecified: Secondary | ICD-10-CM | POA: Insufficient documentation

## 2011-07-26 MED ORDER — LANSOPRAZOLE 30 MG PO CPDR: 30.0000 mg | DELAYED_RELEASE_CAPSULE | Freq: Every day | ORAL | Status: DC

## 2011-07-26 NOTE — Patient Instructions (Addendum)
Physician will contact you with results of urinalysis and blood work.

## 2011-07-26 NOTE — Telephone Encounter (Signed)
[place] left a telephone message this morning, requesting that we call the Lansoprazole 30 mg- Take 1 by mouth twice a day -#60- 5 refills, to [facility]. I have called this to them and it was requested that I put in on the Pharmacist voicemail.

## 2011-07-26 NOTE — Progress Notes (Signed)
Presenting complaint;  Epigastric pain, nausea, diarrhea and weight loss.  Subjective:  Donna Lucas is a 41 year old Caucasian female who is well known to me from previous evaluations who is here for an unscheduled visit. She was seen by me on 05/08/2011 for upper abdominal pain. She was felt to have 2 different types of pains. Right-sided pain was felt to be musculoskeletal and other pain felt to be a stroke or GI pain. She was maintained on PPI and begun on dicyclomine which she has not been taking it regularly. She was seen again on 05-2011 by [name] for right upper and right lower quadrant abdominal pain. Plans were made for her to be referred to a tertiary center. In the meantime she has been to any emergency room where he is 2 different occasions most recently on 07/10/2011. Patient states that she was doing well until 6 months ago when she decided to taper herself off Celexa. She started to experience epigastric pain with intractable nausea anorexia as well as diarrhea. About 3 weeks ago she was begun on Zoloft by Dr. Drue Novel and she recently increased her dose to 75 mg daily. She has taken Zoloft in the past and it has helped. Now she complains of burning and only pain in the epigastric region worse with meals and also associated with bloating and intractable nausea. She is also having intermittent heartburn She states even sight of food makes her sick. She's been having diarrhea for at least a month in 2-4 loose stools per day. She denies melena rectal bleeding fever or chills. She she says she's been under a lot of stress because her husband is working out of town and does not come home except on weekends. She is also extremely concerned that we are missing serious diagnosis. Since her blood glucose levels been slightly elevated she is also concerned she may have diabetes mellitus. She has lost 14 pounds in the last 7 weeks. She denies dysuria or hematuria. She is presently on medical leave  initiated by Dr. Drue Novel.  Current Medications: Current Outpatient Prescriptions  Medication Sig Dispense Refill  . ALPRAZolam (XANAX) 0.5 MG tablet Take 1 tablet (0.5 mg total) by mouth 4 (four) times daily as needed for sleep.  40 tablet  0  . dicyclomine (BENTYL) 10 MG capsule Take 10 mg by mouth 4 (four) times daily -  before meals and at bedtime.      . pantoprazole (PROTONIX) 40 MG tablet Take 1 tablet (40 mg total) by mouth daily.  30 tablet  3  . sertraline (ZOLOFT) 50 MG tablet Take 1 tablet the first 5 days. Take 1.5 tablets daily for the next 5 days. Take 2 tablets everyday after.  60 tablet  0  . promethazine (PHENERGAN) 25 MG suppository Place 1 suppository (25 mg total) rectally every 6 (six) hours as needed for nausea.  12 each  0  . DISCONTD: omeprazole (PRILOSEC) 20 MG capsule Take 20 mg by mouth daily.       Past medical history; Several year history of stress disorder and anxiety. History of anemia; recent hemoglobin low normal. Laparotomy for ectopic pregnancy about 14 years ago. Laparoscopic cholecystectomy November 2011. 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 She was documented to have  bump in her transaminases with pain. EGD prior to ERCP was within normal limits.    Objective: Blood pressure 102/68, pulse 72, temperature 98.6 F (37 C), temperature source Oral, resp. rate 20, height 5'  4" (1.626 m), weight 128 lb (58.06 kg), last menstrual period 07/18/2011. She is very anxious and in tears. Conjunctiva is pink. Sclera is nonicteric Oropharyngeal mucosa is normal. No neck masses or thyromegaly noted. Cardiac exam with regular rhythm normal S1 and S2. No murmur or gallop noted. Lungs are clear to auscultation. Abdomen is scaphoid. Bowel sounds are normal previous no bruits noted. Abdomen is soft with mild midepigastric tenderness but no organomegaly or masses noted.  No LE edema or clubbing noted.  Labs/studies Results: Blood work  from 07/10/2011 reviewed with the patient Urinalysis revealed positive ketones trace urinary hemoglobin, 0-2 WBCs and 7 or 10 RBCs. WBC 7.4, H&H 11.9 and 35.5 platelet count 271K. Comprehensive chemistry panel pertinent for serum potassium of 3.3 and glucose of 102. Daily 1.0, AP 40, AST 14, ALT less than 5 and albumin 4.2. Calcium 9.1. Abdominal pelvic CT films from 06/23/2011 also reviewed with patient;       Assessment:  Patient has multiple symptoms including intractable nausea anorexia with weight loss as well as upper abdominal pain and nonbloody diarrhea. Recent stool C. difficile by PCR was negative not mentioned above. She has not responded to PPI and anti-spasmodic therapy. Recent abdominopelvic CT failed to show any abnormality to account for symptoms. Her LFTs appear to have been normal therefore her pain is not of biliary origin. It is possible a lot of her symptoms are secondary to stress and anxiety. Abnormal urinalysis with red cells recently. UA was done while she was on her period. Mildly elevated glucose levels. Addison's disease is also in differential diagnosis.   Plan:   Discontinue pantoprazole. Lansoprazole 30 mg by mouth twice a day. Continue dicyclomine 10 mg 4 times a day when necessary. Patient advised to take at least 2 doses daily She will go the lab for urinalysis, random cortisol level and hemoglobin A1c. Office visit in 4 weeks.

## 2011-07-26 NOTE — Telephone Encounter (Signed)
Prescription called to the [facility]

## 2011-07-27 ENCOUNTER — Other Ambulatory Visit: Payer: Self-pay | Admitting: *Deleted

## 2011-07-27 LAB — HEMOGLOBIN A1C: Mean Plasma Glucose: 117 mg/dL — ABNORMAL HIGH (ref <117)

## 2011-07-27 LAB — URINALYSIS
Glucose, UA: NEGATIVE mg/dL
Leukocytes, UA: NEGATIVE
Protein, ur: NEGATIVE mg/dL
pH: 6 (ref 5.0–8.0)

## 2011-07-27 MED ORDER — ALPRAZOLAM 0.5 MG PO TABS: 0.5000 mg | ORAL_TABLET | Freq: Four times a day (QID) | ORAL | Status: DC | PRN

## 2011-07-27 NOTE — Telephone Encounter (Signed)
Last filled 07-07-11 #40, last OV 07-10-11.

## 2011-07-27 NOTE — Telephone Encounter (Signed)
40, no RF 

## 2011-07-27 NOTE — Telephone Encounter (Signed)
Rx sent 

## 2011-07-28 ENCOUNTER — Telehealth: Payer: Self-pay | Admitting: *Deleted

## 2011-07-28 ENCOUNTER — Telehealth (INDEPENDENT_AMBULATORY_CARE_PROVIDER_SITE_OTHER): Payer: Self-pay | Admitting: *Deleted

## 2011-07-28 DIAGNOSIS — R319 Hematuria, unspecified: Secondary | ICD-10-CM

## 2011-07-28 LAB — URINE CULTURE: Colony Count: NO GROWTH

## 2011-07-28 NOTE — Telephone Encounter (Signed)
Per Dr.Rehman the patient will go to the lab on June 7 th to have a repeat U/A with Microscopy. Lab order was faxed on May 31,2013

## 2011-07-28 NOTE — Telephone Encounter (Signed)
Pt has increased to the Zoloft 100 mg for the past 2 day an since then she has felt strange, disconnected and numb. Pt indicated that she did better on the 75 mg.Please advise   s

## 2011-07-28 NOTE — Telephone Encounter (Signed)
Stay on 75 mg for now , try to go up to 100 mg next week, if intolerant again, then just stay in 75 mg

## 2011-07-28 NOTE — Telephone Encounter (Signed)
Discuss with patient  

## 2011-07-28 NOTE — Telephone Encounter (Signed)
Donna Lucas could you resend this Rx.      KP

## 2011-07-28 NOTE — Telephone Encounter (Signed)
Pharmacy advised patient they do not have can this be re-sent please

## 2011-08-10 ENCOUNTER — Telehealth (INDEPENDENT_AMBULATORY_CARE_PROVIDER_SITE_OTHER): Payer: Self-pay | Admitting: *Deleted

## 2011-08-10 NOTE — Telephone Encounter (Signed)
Donna Lucas called the office this morning, states that she is trying to eat ,but that when she does about 1-2 hours later she has Epigastric pain,Nausea,and stomach bothers her. She is taking Prilosec twice a day, she questions after having talked with a Co-Worker,if she might have H-Pylori? Also wants Dr.Rehman to know that she has an appointment at [facility] in 2 weeks for Endoscopy [facility].

## 2011-08-10 NOTE — Telephone Encounter (Signed)
I called patient and talked with her. She had H. pylori in October 2010 was negative. Will wait for results of [place] before further studies undertaken.

## 2011-08-14 ENCOUNTER — Ambulatory Visit (INDEPENDENT_AMBULATORY_CARE_PROVIDER_SITE_OTHER): Payer: BC Managed Care – PPO | Admitting: Internal Medicine

## 2011-08-18 ENCOUNTER — Ambulatory Visit (INDEPENDENT_AMBULATORY_CARE_PROVIDER_SITE_OTHER): Payer: BC Managed Care – PPO | Admitting: Internal Medicine

## 2011-08-18 VITALS — BP 102/70 | HR 97 | Temp 98.2°F | Wt 127.0 lb

## 2011-08-18 DIAGNOSIS — F411 Generalized anxiety disorder: Secondary | ICD-10-CM

## 2011-08-18 DIAGNOSIS — R112 Nausea with vomiting, unspecified: Secondary | ICD-10-CM

## 2011-08-18 MED ORDER — SERTRALINE HCL 100 MG PO TABS: 150.0000 mg | ORAL_TABLET | Freq: Every day | ORAL | Status: DC

## 2011-08-18 MED ORDER — ALPRAZOLAM 0.5 MG PO TABS: 0.5000 mg | ORAL_TABLET | Freq: Four times a day (QID) | ORAL | Status: AC | PRN

## 2011-08-18 NOTE — Assessment & Plan Note (Addendum)
Severe anxiety, she was change from citalopram to Zoloft 75 mg. Currently better but definitely not well. She sees a Warden/ranger weekly. Plan: Continue counseling Gradually increase Zoloft from 75, to 100, 150. Reassess in 6 weeks, if not better will need a psychiatrist Continue with Xanax when necessary Extensive forms completed, she has been unable to work from 06/28/2011 to 07/31/2011, since then, she feels she is able to work full-time.

## 2011-08-18 NOTE — Assessment & Plan Note (Signed)
Closely followed by GI

## 2011-08-18 NOTE — Progress Notes (Signed)
  Subjective:    Patient ID: Donna Lucas, female    DOB: 13-Jun-1970, 41 y.o.   MRN: [id number]  HPI Routine followup Since the last time I saw her, she continue with GI symptoms, follow up closely by GI. As far as anxiety, she's taking sertraline 75 mg, Xanax as needed. Overall better, but definitely not well. She reports that she's not getting hysterical anymore but she continues to be very anxious. No depression.  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 Slightly anxious, definitely better than before. Vital signs stable      Assessment & Plan:  Today , I spent more than 15 min with the patient, >50% of the time counseling, and completing extensive paperwork

## 2011-08-18 NOTE — Patient Instructions (Addendum)
Sertraline 100 mg: 1 tab a day x 2 weeks Then 1.5 tabs a day  Return for a office visit in 5 to 6 weeks

## 2011-08-20 ENCOUNTER — Encounter: Payer: Self-pay | Admitting: Internal Medicine

## 2011-08-29 ENCOUNTER — Ambulatory Visit (INDEPENDENT_AMBULATORY_CARE_PROVIDER_SITE_OTHER): Payer: BC Managed Care – PPO | Admitting: Internal Medicine

## 2011-09-19 ENCOUNTER — Other Ambulatory Visit: Payer: Self-pay | Admitting: Internal Medicine

## 2011-09-19 NOTE — Telephone Encounter (Signed)
Ok to refill 

## 2011-09-26 ENCOUNTER — Ambulatory Visit (INDEPENDENT_AMBULATORY_CARE_PROVIDER_SITE_OTHER): Payer: BC Managed Care – PPO | Admitting: Internal Medicine

## 2011-10-02 ENCOUNTER — Ambulatory Visit (INDEPENDENT_AMBULATORY_CARE_PROVIDER_SITE_OTHER): Payer: BC Managed Care – PPO | Admitting: Internal Medicine

## 2011-10-19 ENCOUNTER — Encounter: Payer: Self-pay | Admitting: Internal Medicine

## 2011-10-19 ENCOUNTER — Ambulatory Visit (INDEPENDENT_AMBULATORY_CARE_PROVIDER_SITE_OTHER): Payer: BC Managed Care – PPO | Admitting: Internal Medicine

## 2011-10-19 VITALS — BP 128/80 | HR 75 | Temp 98.5°F | Wt 127.0 lb

## 2011-10-19 DIAGNOSIS — F411 Generalized anxiety disorder: Secondary | ICD-10-CM

## 2011-10-19 DIAGNOSIS — A6920 Lyme disease, unspecified: Secondary | ICD-10-CM

## 2011-10-19 MED ORDER — AMOXICILLIN 500 MG PO CAPS: 1000.0000 mg | ORAL_CAPSULE | Freq: Two times a day (BID) | ORAL | Status: AC

## 2011-10-19 NOTE — Assessment & Plan Note (Signed)
Has improved significantly in the last 2 months. Currently on Zoloft 100 mg daily, she is also followed by psychiatry.

## 2011-10-19 NOTE — Assessment & Plan Note (Addendum)
Patient was recently eval at the urgent care and ER with severe headache, fever and body aches. Reportedly, no CT of the head or  chest x-ray were done however she tested positive for Lyme disease. She has received 4 days of doxycycline by mouth and she feels better. Denies neck stiffnes or a rash. She is nontoxic and afebrile. She still had some sweats. She has developed some abdominal pain since she started doxycycline Plan: Get records from the urgent care and ER, discontinue doxycycline due to GI side effects and Amoxicillin for 10 days

## 2011-10-19 NOTE — Progress Notes (Signed)
  Subjective:    Patient ID: Donna Lucas, female    DOB: 10-05-1970, 41 y.o.   MRN: [id number]  HPI Here for the following issues: --Anxiety, doing much better, currently on Zoloft 100 mg daily. See psychiatry. --Lyme disease, About 5 days ago developed severe headache (worse of life), fever up to 103 and aches. Because she has a history of tick bites the urgent care physician check titers and started her on doxycycline. 3 days ago as she was not better was sent to the ER, she was told that the wbc's were low and she probably had a viral issue however was recommended to continue with doxycycline. 2 days ago, she received a phone call from the urgent care and she was told her Lyme test was positive. She is here for followup.  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 Fever is gone, she still has chills and sweats. Headache is still there, much milder, usually behind the eyes. One tylenol is enough to take care of the pain. Denies any neck stiffness or rash. Joint aches are decreasing. Denies any cough, sinus discharge or congestion. Apparently, she did not have a CT of the head or a chest x-ray probably evaluation of the urgent care or ER.     Objective:   Physical Exam  General -- alert, well-developed, and not overweight appearing. No apparent distress.  Neck -- full range of motion HEENT --  nose not congested, face symmetric, not tender to  palpation. Lungs -- normal respiratory effort, no intercostal retractions, no accessory muscle use, and normal breath sounds.   Heart-- normal rate, regular rhythm, no murmur, and no gallop.   Abdomen--soft, slightly tender at the upper abdomen, no HSM, no guarding, and no rigidity.   Extremities-- no pretibial edema bilaterally  Neurologic-- alert & oriented X3 , EOMI, speech gait and motor are intact Psych-- Cognition and judgment appear intact. Alert and cooperative with normal attention span and concentration.  not anxious appearing and not depressed appearing.      Assessment & Plan:

## 2011-10-19 NOTE — Patient Instructions (Addendum)
Take amoxicillin for 10 days. Call anytime or go to the ER if  fever, severe headache, any rash, neck stiffness. Also let us know if you're not completely back to normal in 2 weeks

## 2011-10-20 ENCOUNTER — Telehealth: Payer: Self-pay | Admitting: *Deleted

## 2011-10-20 NOTE — Telephone Encounter (Signed)
Discussed with pt

## 2011-10-20 NOTE — Telephone Encounter (Signed)
Pt stated that she started taking her Prevacid this morning & wanted to make sure if it's ok to take with the Amoxicillin. Please advise.

## 2011-10-20 NOTE — Telephone Encounter (Signed)
Yes

## 2011-10-23 ENCOUNTER — Telehealth: Payer: Self-pay | Admitting: Internal Medicine

## 2011-10-23 NOTE — Telephone Encounter (Signed)
please call regarding test results & medications Cb# [phone]

## 2011-10-23 NOTE — Telephone Encounter (Signed)
Pt would like to know if she needs to come back to have more labs done after she finishes her antibiotic. Please advise.

## 2011-10-24 NOTE — Telephone Encounter (Signed)
Advise patient, records reviewed, Lyme test was positive, I can't  tell from the labs if this is a old or recent infection. Nevertheless I think is appropriate to finish antibiotics as prescribed.

## 2011-10-24 NOTE — Telephone Encounter (Signed)
Discussed with pt

## 2011-10-31 ENCOUNTER — Telehealth: Payer: Self-pay | Admitting: Internal Medicine

## 2011-10-31 ENCOUNTER — Other Ambulatory Visit: Payer: Self-pay | Admitting: Internal Medicine

## 2011-10-31 NOTE — Telephone Encounter (Signed)
Pt states she was told to call you if she was not feeling better. Pt states she had finished the antibiotic and feels some better, but her lymph nodes are still swollen. Call back at [phone]

## 2011-10-31 NOTE — Telephone Encounter (Deleted)
Refill: Advair 250-50 diskus. Use 1 inhalation every 12 hours. Qty 60. Last fill 09-10-11

## 2011-10-31 NOTE — Telephone Encounter (Signed)
Left detailed msg on pt's vmail.  

## 2011-10-31 NOTE — Telephone Encounter (Signed)
Please advise 

## 2011-10-31 NOTE — Telephone Encounter (Signed)
Refill done.  

## 2011-10-31 NOTE — Telephone Encounter (Signed)
Recommend to wait and see, OV in 2 weeks if not back to normal

## 2011-11-01 ENCOUNTER — Telehealth: Payer: Self-pay | Admitting: Internal Medicine

## 2011-11-01 NOTE — Telephone Encounter (Signed)
Opened in error. BC °

## 2011-11-01 NOTE — Telephone Encounter (Signed)
I recommend to avoid further antibiotics at this point

## 2011-11-01 NOTE — Telephone Encounter (Signed)
Pt called back today & stated that she would like a refill of the amoxicillin. Pt states that her glands are still swollen. Please advise.

## 2011-11-01 NOTE — Telephone Encounter (Signed)
Discussed with pt

## 2011-11-14 ENCOUNTER — Ambulatory Visit: Payer: BC Managed Care – PPO | Admitting: Internal Medicine

## 2012-02-01 ENCOUNTER — Other Ambulatory Visit: Payer: Self-pay | Admitting: Internal Medicine

## 2012-02-01 NOTE — Telephone Encounter (Signed)
Refill done.  

## 2012-04-13 ENCOUNTER — Other Ambulatory Visit: Payer: Self-pay

## 2012-05-11 ENCOUNTER — Other Ambulatory Visit: Payer: Self-pay | Admitting: Internal Medicine

## 2012-05-13 NOTE — Telephone Encounter (Signed)
Refill done.  

## 2012-05-13 NOTE — Telephone Encounter (Signed)
Patient called in regarding this and hopes that med is refilled sooner than later due to ice.

## 2012-07-09 ENCOUNTER — Telehealth: Payer: Self-pay | Admitting: Internal Medicine

## 2012-07-09 NOTE — Telephone Encounter (Signed)
Advise patient, needs to call her psychiatrist for a refill.

## 2012-07-09 NOTE — Telephone Encounter (Signed)
Pt called in and stated that rx is not at pharm and she would like it called in for her.

## 2012-07-09 NOTE — Telephone Encounter (Signed)
Ok to refill alprazolam 0.5mg  Last OV 8.22.13 Last filled 7.23.13

## 2012-07-10 NOTE — Telephone Encounter (Signed)
Called pt.

## 2012-07-10 NOTE — Telephone Encounter (Signed)
Pt made aware she needs to call her psychiatrist. Pt understood.

## 2012-07-19 ENCOUNTER — Telehealth: Payer: Self-pay | Admitting: Internal Medicine

## 2012-07-19 MED ORDER — SERTRALINE HCL 100 MG PO TABS: 100.0000 mg | ORAL_TABLET | Freq: Every day | ORAL | Status: DC

## 2012-07-19 NOTE — Telephone Encounter (Signed)
Discuss with patient, Rx sent, Pt with pending OV.

## 2012-07-19 NOTE — Telephone Encounter (Signed)
Tried to contact Pt phone is not a working number. Spoke with Pt husband who indicated that Pt just changed her number. Left message to call office.

## 2012-07-19 NOTE — Telephone Encounter (Signed)
Please clarify Zoloft dose and call 1 months supply and one refill. Before next refill, I need to see her, last seen  more than 6 months ago. Arrange a visit

## 2012-07-19 NOTE — Telephone Encounter (Signed)
Patient called requesting rx for zoloft. She states she sees a Warden/ranger, not psychiatrist, and she cannot prescribe her medcation. [facility] [address] [facility] [place].

## 2012-07-20 ENCOUNTER — Other Ambulatory Visit: Payer: Self-pay | Admitting: Internal Medicine

## 2012-07-23 NOTE — Telephone Encounter (Signed)
Spoke to pharmacy & pt still has refills left on file. Refill request sent in error.  

## 2012-07-25 ENCOUNTER — Ambulatory Visit: Payer: BC Managed Care – PPO | Admitting: Internal Medicine

## 2012-07-30 ENCOUNTER — Encounter: Payer: Self-pay | Admitting: Lab

## 2012-07-30 ENCOUNTER — Encounter: Payer: Self-pay | Admitting: Internal Medicine

## 2012-07-30 ENCOUNTER — Ambulatory Visit (INDEPENDENT_AMBULATORY_CARE_PROVIDER_SITE_OTHER): Payer: BC Managed Care – PPO | Admitting: Internal Medicine

## 2012-07-30 VITALS — BP 110/72 | HR 76 | Temp 98.1°F | Wt 132.0 lb

## 2012-07-30 DIAGNOSIS — F411 Generalized anxiety disorder: Secondary | ICD-10-CM

## 2012-07-30 MED ORDER — SERTRALINE HCL 100 MG PO TABS: 100.0000 mg | ORAL_TABLET | Freq: Every day | ORAL | Status: DC

## 2012-07-30 MED ORDER — ALPRAZOLAM 0.5 MG PO TABS: ORAL_TABLET | ORAL | Status: DC

## 2012-07-30 NOTE — Progress Notes (Signed)
  Subjective:    Patient ID: ISEL SKUFCA, female    DOB: 1971-02-11, 42 y.o.   MRN: [id number]  HPI Routine office visit Needs a refill on Zoloft and Xanax. She is doing very well, she sees a Veterinary surgeon, currently sees them as needed only. She self decreased Zoloft from 1.5 tablets to one tablet, "I  felt numb with a higher dose", again currently feels well. Has not used Xanax lately but would like to have a prescription, having some issues with insomnia.  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 No chest pain or shortness of breath. No suicidal ideas.     Objective:   Physical Exam BP 110/72  Pulse 76  Temp(Src) 98.1 F (36.7 C) (Oral)  Wt 132 lb (59.875 kg)  BMI 22.65 kg/m2  SpO2 99%  General -- alert, well-developed, nad .   Neck --no thyromegaly  Lungs -- normal respiratory effort, no intercostal retractions, no accessory muscle use, and normal breath sounds.   Heart-- normal rate, regular rhythm, no murmur, and no gallop.   Extremities-- no pretibial edema bilaterally Psych-- Cognition and judgment appear intact. Alert and cooperative with normal attention span and concentration.  not anxious appearing and not depressed appearing.       Assessment & Plan:

## 2012-07-30 NOTE — Patient Instructions (Addendum)
Next visit in 6-8 months

## 2012-07-30 NOTE — Assessment & Plan Note (Signed)
Currently very well controlled, self decreased Zoloft from 150 mg to 100 mg daily. Has a counselor, will see her when necessary. Request a Xanax, has not taken in a while but would like to have it just in case ; currently going through a divorce and having some issues with her older son and has difficulty sleeping from time to time. Plan: Refill meds Will need a controlled substance contract and a UDS (noting she has not taken xanax regularly, denies illicit drugs)

## 2012-08-19 ENCOUNTER — Encounter: Payer: Self-pay | Admitting: Internal Medicine

## 2013-01-02 ENCOUNTER — Other Ambulatory Visit: Payer: Self-pay

## 2013-01-14 ENCOUNTER — Ambulatory Visit (INDEPENDENT_AMBULATORY_CARE_PROVIDER_SITE_OTHER): Payer: BC Managed Care – PPO | Admitting: Internal Medicine

## 2013-01-14 ENCOUNTER — Encounter: Payer: Self-pay | Admitting: Internal Medicine

## 2013-01-14 VITALS — BP 118/76 | HR 98 | Temp 100.2°F | Wt 120.0 lb

## 2013-01-14 DIAGNOSIS — R509 Fever, unspecified: Secondary | ICD-10-CM

## 2013-01-14 DIAGNOSIS — J029 Acute pharyngitis, unspecified: Secondary | ICD-10-CM

## 2013-01-14 MED ORDER — AZITHROMYCIN 250 MG PO TABS: ORAL_TABLET | ORAL | Status: DC

## 2013-01-14 NOTE — Progress Notes (Signed)
  Subjective:    Patient ID: Donna Lucas, female    DOB: Dec 20, 1970, 42 y.o.   MRN: [id number]  HPI Acute Visit Symptoms started 2 days ago: Fever up to 102.7,headache (generalized) Also myalgias and significant sore throat. Has been taking Tylenol or Advil and still has mild temperature this morning. 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 Denies nausea, vomiting, diarrhea      Objective:   Physical Exam  BP 118/76  Pulse 98  Temp(Src) 100.2 F (37.9 C)  Wt 120 lb (54.432 kg)  SpO2 100% General -- alert, well-developed, NAD, non toxic appearing.  Neck -- FROM HEENT-- Not pale. TMs normal, throat symmetric, slt redness, tonsils normal size but w/ some  discharge. Face symmetric, sinuses not tender to palpation. Nose not congested. Lungs -- normal respiratory effort, no intercostal retractions, no accessory muscle use, and normal breath sounds.  Heart-- normal rate, regular rhythm, no murmur.   Extremities-- no pretibial edema bilaterally  Neurologic--  alert & oriented X3. Speech normal, gait normal, strength normal in all extremities.  Psych-- Cognition and judgment appear intact. Cooperative with normal attention span and concentration. No anxious appearing , no depressed appearing.        Assessment & Plan:  Fever, Fever  associated with headache and sore throat, no meningeal signs. DDX-- Strep throat, viral illness, mono, others. Rapid a strep test negative. Plan: Monospot, throat culture, empiric zpack, see instructions.

## 2013-01-14 NOTE — Patient Instructions (Signed)
Rest, fluids, tylenol or motrin for headaches, fever  Motrin 200 mg 2 tablets every 6 hours as needed for pain. Always take it with food because may cause gastritis and ulcers. If you notice nausea, stomach pain, change in the color of stools --->  Stop the medicine and let us know Tylenol  500 mg OTC 2 tabs a day every 8 hours as needed for pain  If cough, take Mucinex DM twice a day as needed  Take the antibiotic as prescribed  (zithronmax) Call if no better in few days Call anytime if the symptoms are severe, you have high fever, short of breath, a rash, severe headache

## 2013-01-14 NOTE — Progress Notes (Signed)
Pre visit review using our clinic review tool, if applicable. No additional management support is needed unless otherwise documented below in the visit note. 

## 2013-01-15 ENCOUNTER — Telehealth: Payer: Self-pay | Admitting: Internal Medicine

## 2013-01-15 NOTE — Telephone Encounter (Signed)
Patient called about her lab results.

## 2013-01-16 ENCOUNTER — Other Ambulatory Visit: Payer: Self-pay | Admitting: *Deleted

## 2013-01-16 LAB — CULTURE, GROUP A STREP

## 2013-01-16 MED ORDER — AMOXICILLIN 500 MG PO CAPS: 1000.0000 mg | ORAL_CAPSULE | Freq: Two times a day (BID) | ORAL | Status: DC

## 2013-04-25 ENCOUNTER — Other Ambulatory Visit: Payer: Self-pay | Admitting: Internal Medicine

## 2013-09-01 ENCOUNTER — Ambulatory Visit: Payer: BC Managed Care – PPO | Admitting: Internal Medicine

## 2013-09-08 ENCOUNTER — Ambulatory Visit: Payer: BC Managed Care – PPO | Admitting: Internal Medicine

## 2013-09-08 DIAGNOSIS — Z0289 Encounter for other administrative examinations: Secondary | ICD-10-CM

## 2013-12-02 ENCOUNTER — Other Ambulatory Visit: Payer: Self-pay

## 2013-12-02 MED ORDER — SERTRALINE HCL 100 MG PO TABS: ORAL_TABLET | ORAL | Status: DC

## 2013-12-29 ENCOUNTER — Other Ambulatory Visit: Payer: Self-pay | Admitting: Internal Medicine

## 2014-01-09 ENCOUNTER — Ambulatory Visit (INDEPENDENT_AMBULATORY_CARE_PROVIDER_SITE_OTHER): Payer: BC Managed Care – PPO | Admitting: Internal Medicine

## 2014-01-09 ENCOUNTER — Encounter: Payer: Self-pay | Admitting: Internal Medicine

## 2014-01-09 VITALS — BP 112/62 | HR 66 | Temp 98.2°F | Wt 126.1 lb

## 2014-01-09 DIAGNOSIS — R911 Solitary pulmonary nodule: Secondary | ICD-10-CM

## 2014-01-09 DIAGNOSIS — F419 Anxiety disorder, unspecified: Secondary | ICD-10-CM

## 2014-01-09 MED ORDER — SERTRALINE HCL 100 MG PO TABS: 100.0000 mg | ORAL_TABLET | Freq: Every day | ORAL | Status: DC

## 2014-01-09 NOTE — Progress Notes (Signed)
Pre visit review using our clinic review tool, if applicable. No additional management support is needed unless otherwise documented below in the visit note. 

## 2014-01-09 NOTE — Assessment & Plan Note (Signed)
Stable for years, see report from CT 2013

## 2014-01-09 NOTE — Patient Instructions (Signed)
Please come back to the office in 1 year for a routine check up

## 2014-01-09 NOTE — Assessment & Plan Note (Signed)
Symptoms well-controlled with SSRIs, refill medications, return to the office one year. The patient sees gynecology regularly, they are doing her physical exams

## 2014-01-09 NOTE — Progress Notes (Signed)
   Subjective:    Patient ID: Donna Lucas, female    DOB: 04/27/1970, 43 y.o.   MRN: [id number]  DOS:  01/09/2014 Type of visit - description : rov Interval history: History of anxiety, symptoms currently well controlled on Zoloft, needs a refill   ROS Denies depression, no difficulties with insomnia   Past Medical History  Diagnosis Date  . Anemia   . Elevated LFTs     after cholecystectomy  . Abnormal CT scan, chest 02-2009    solitary nodule,f/u by pulmonology, neck CT, April 2011  . Anxiety     Past Surgical History  Procedure Laterality Date  . Ovarian cyst removal    . Cholecystectomy    . Ectopic pregnancy surgery    . Ercp      2 yrs ago after her GB removed    History   Social History  . Marital Status: Married    Spouse Name: N/A    Number of Children: 3  . Years of Education: N/A   Occupational History  . teacher- elementary    Social History Main Topics  . Smoking status: Former Smoker    Types: Cigarettes    Quit date: 05/08/1991  . Smokeless tobacco: Never Used     Comment: greater than 20 yrs ago ,patient smokes less than 1/2 pack a day  . Alcohol Use: No  . Drug Use: No  . Sexual Activity: Not on file   Other Topics Concern  . Not on file   Social History Narrative        Medication List       This list is accurate as of: 01/09/14 11:59 PM.  Always use your most recent med list.               promethazine 25 MG suppository  Commonly known as:  PHENERGAN  Place 1 suppository (25 mg total) rectally every 6 (six) hours as needed for nausea.     sertraline 100 MG tablet  Commonly known as:  ZOLOFT  Take 1 tablet (100 mg total) by mouth daily.           Objective:   Physical Exam BP 112/62 mmHg  Pulse 66  Temp(Src) 98.2 F (36.8 C) (Oral)  Wt 126 lb 2 oz (57.21 kg)  SpO2 99%  LMP 01/09/2014  General -- alert, well-developed, NAD.  Lungs -- normal respiratory effort, no intercostal retractions, no accessory muscle  use, and normal breath sounds.  Heart-- normal rate, regular rhythm, no murmur.  Extremities-- no pretibial edema bilaterally  Neurologic--  alert & oriented X3. Speech normal, gait appropriate for age, strength symmetric and appropriate for age.  Psych-- Cognition and judgment appear intact. Cooperative with normal attention span and concentration. No anxious or depressed appearing.       Assessment & Plan:

## 2014-10-27 ENCOUNTER — Other Ambulatory Visit: Payer: Self-pay

## 2014-10-28 ENCOUNTER — Ambulatory Visit: Payer: Self-pay | Admitting: Internal Medicine

## 2014-11-20 ENCOUNTER — Other Ambulatory Visit: Payer: Self-pay | Admitting: Internal Medicine

## 2014-12-18 ENCOUNTER — Other Ambulatory Visit: Payer: Self-pay | Admitting: Internal Medicine

## 2014-12-22 ENCOUNTER — Encounter: Payer: Self-pay | Admitting: Internal Medicine

## 2014-12-23 ENCOUNTER — Other Ambulatory Visit: Payer: Self-pay

## 2014-12-23 MED ORDER — SERTRALINE HCL 100 MG PO TABS: 100.0000 mg | ORAL_TABLET | Freq: Every day | ORAL | Status: DC

## 2014-12-30 ENCOUNTER — Telehealth: Payer: Self-pay | Admitting: Internal Medicine

## 2014-12-30 ENCOUNTER — Ambulatory Visit: Payer: Self-pay | Admitting: Internal Medicine

## 2014-12-30 DIAGNOSIS — Z0289 Encounter for other administrative examinations: Secondary | ICD-10-CM

## 2014-12-30 NOTE — Telephone Encounter (Signed)
Charge, missed appt on 10/28/2014 as well.

## 2014-12-30 NOTE — Telephone Encounter (Signed)
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

## 2015-01-04 ENCOUNTER — Ambulatory Visit: Payer: Self-pay | Admitting: Internal Medicine

## 2015-01-05 ENCOUNTER — Ambulatory Visit (INDEPENDENT_AMBULATORY_CARE_PROVIDER_SITE_OTHER): Payer: BLUE CROSS/BLUE SHIELD | Admitting: Internal Medicine

## 2015-01-05 ENCOUNTER — Encounter: Payer: Self-pay | Admitting: Internal Medicine

## 2015-01-05 VITALS — BP 108/74 | HR 65 | Temp 98.5°F | Ht 64.0 in | Wt 125.4 lb

## 2015-01-05 DIAGNOSIS — Z09 Encounter for follow-up examination after completed treatment for conditions other than malignant neoplasm: Secondary | ICD-10-CM | POA: Insufficient documentation

## 2015-01-05 DIAGNOSIS — F419 Anxiety disorder, unspecified: Secondary | ICD-10-CM | POA: Diagnosis not present

## 2015-01-05 MED ORDER — SERTRALINE HCL 100 MG PO TABS: 100.0000 mg | ORAL_TABLET | Freq: Every day | ORAL | Status: DC

## 2015-01-05 NOTE — Assessment & Plan Note (Signed)
Anxiety: Well-controlled, refill sertraline. History of anemia: Recommend to see gynecology Primary care: [facility]- [facility] @ gynecology Had a tetanus shot 2011 Will get a flu shot t her job RTC in one year for a physical, fasting.

## 2015-01-05 NOTE — Patient Instructions (Signed)
  Next visit  for a physical exam in 1 year, fasting      Please schedule an appointment at the front desk

## 2015-01-05 NOTE — Progress Notes (Signed)
   Subjective:    Patient ID: Donna Lucas, female    DOB: 02/27/1970, 44 y.o.   MRN: [id number]  DOS:  01/05/2015 Type of visit - description : Routine office visit Interval history: Anxiety: Well controlled with current medications, feeling great.  Review of Systems Denies chest pain or difficulty breathing. No nausea, vomiting, diarrhea or abdominal pain. No headache or dizziness. Periods are still heavy. Sees gynecology  Past Medical History  Diagnosis Date  . Anemia   . Elevated LFTs     after cholecystectomy  . Abnormal CT scan, chest 02-2009    solitary nodule,f/u by pulmonology, neck CT, April 2011  . Anxiety     Past Surgical History  Procedure Laterality Date  . Ovarian cyst removal    . Cholecystectomy    . Ectopic pregnancy surgery    . Ercp      2 yrs ago after her GB removed    Social History   Social History  . Marital Status: Married    Spouse Name: N/A  . Number of Children: 3  . Years of Education: N/A   Occupational History  . teacher- elementary, [place]    Social History Main Topics  . Smoking status: Former Smoker    Types: Cigarettes    Quit date: 05/08/1991  . Smokeless tobacco: Never Used     Comment: greater than 20 yrs ago ,patient smokes less than 1/2 pack a day  . Alcohol Use: No  . Drug Use: No  . Sexual Activity: Not on file   Other Topics Concern  . Not on file   Social History Narrative        Medication List       This list is accurate as of: 01/05/15  6:05 PM.  Always use your most recent med list.               sertraline 100 MG tablet  Commonly known as:  ZOLOFT  Take 1 tablet (100 mg total) by mouth daily.           Objective:   Physical Exam BP 108/74 mmHg  Pulse 65  Temp(Src) 98.5 F (36.9 C) (Oral)  Ht 5\' 4"  (1.626 m)  Wt 125 lb 6 oz (56.87 kg)  BMI 21.51 kg/m2  SpO2 97%  LMP 01/02/2015 (Exact Date) General:   Well developed, well nourished . NAD.  HEENT:  Normocephalic . Face  symmetric, atraumatic Lungs:  CTA B Normal respiratory effort, no intercostal retractions, no accessory muscle use. Heart: RRR,  no murmur.  No pretibial edema bilaterally  Skin: Not pale. Not jaundice Neurologic:  alert & oriented X3.  Speech normal, gait appropriate for age and unassisted Psych--  Cognition and judgment appear intact.  Cooperative with normal attention span and concentration.  Behavior appropriate. No anxious or depressed appearing.      Assessment & Plan:   Assessment> Anxiety H/o Solitary nodule per CT chest, stable for years, last CT 2013 H/o anemia-likely from heavy periods, sees GI   Plan: Anxiety: Well-controlled, refill sertraline. History of anemia: Recommend to see gynecology Primary care: [facility]- [facility] @ gynecology Had a tetanus shot 2011 Will get a flu shot t her job RTC in one year for a physical, fasting.

## 2015-01-05 NOTE — Progress Notes (Signed)
Pre visit review using our clinic review tool, if applicable. No additional management support is needed unless otherwise documented below in the visit note. 

## 2015-05-26 ENCOUNTER — Other Ambulatory Visit (HOSPITAL_COMMUNITY)
Admission: RE | Admit: 2015-05-26 | Discharge: 2015-05-26 | Disposition: A | Discharge status: Home / Self Care | Payer: BC Managed Care – PPO | Source: Ambulatory Visit | Attending: Obstetrics and Gynecology | Admitting: Obstetrics and Gynecology

## 2015-05-26 ENCOUNTER — Ambulatory Visit (INDEPENDENT_AMBULATORY_CARE_PROVIDER_SITE_OTHER): Payer: BC Managed Care – PPO | Admitting: Obstetrics and Gynecology

## 2015-05-26 ENCOUNTER — Encounter: Payer: Self-pay | Admitting: Obstetrics and Gynecology

## 2015-05-26 VITALS — BP 100/60 | Ht 65.0 in | Wt 130.5 lb

## 2015-05-26 DIAGNOSIS — Z1151 Encounter for screening for human papillomavirus (HPV): Secondary | ICD-10-CM | POA: Insufficient documentation

## 2015-05-26 DIAGNOSIS — Z01419 Encounter for gynecological examination (general) (routine) without abnormal findings: Secondary | ICD-10-CM | POA: Diagnosis not present

## 2015-05-26 NOTE — Progress Notes (Signed)
Patient ID: [name], female   DOB: 07/09/1970, 45 y.o.   MRN: [id number] Pt here today for annual exam. Pt states that she would like to discuss having her period every two weeks with Dr. Emelda FearFerguson.

## 2015-05-26 NOTE — Progress Notes (Signed)
Patient name: Donna Lucas MRN [id number]  Date of birth: 06/04/1970  Assessment:  Annual Gyn Exam Normal Female Exam except for Retroverted Uterus  Plan:  1. pap smear done, next pap due in 3 years 2. return annually or prn 3    Annual mammogram advised Subjective:  Donna Lucas is a 45 y.o. female with a history of ectopic pregnancy surgery and ovarian cyst removal, who presents for annual exam. Patient's last menstrual period was 05/16/2015. The patient has complaints today of polymenorrhea. She states she has had menses every 2 weeks for the past 4 months. Patient notes a history of cervical polyp x 2 that would cause bleeding in between periods. She states before that, she used to have menses every 26-27 days. She denies any bowel complaints.   She states she has had 3 children, all delivered vaginally.   The following portions of the patient's history were reviewed and updated as appropriate: allergies, current medications, past family history, past medical history, past social history, past surgical history and problem list. Past Medical History  Diagnosis Date  . Anemia   . Elevated LFTs     after cholecystectomy  . Abnormal CT scan, chest 02-2009    solitary nodule,f/u by pulmonology, neck CT, April 2011  . Anxiety     Past Surgical History  Procedure Laterality Date  . Ovarian cyst removal    . Cholecystectomy    . Ectopic pregnancy surgery    . Ercp      2 yrs ago after her GB removed     Current outpatient prescriptions:  .  sertraline (ZOLOFT) 100 MG tablet, Take 1 tablet (100 mg total) by mouth daily., Disp: 30 tablet, Rfl: 12 .  [DISCONTINUED] omeprazole (PRILOSEC) 20 MG capsule, Take 20 mg by mouth daily., Disp: , Rfl:   Review of Systems Constitutional: negative Gastrointestinal: negative Genitourinary: frequent menses, every 2 weeks A complete 10 system review of systems was obtained and all systems are negative except as noted in the HPI and PMH.     Objective:  BP 100/60 mmHg  Ht 5\' 5"  (1.651 m)  Wt 130 lb 8 oz (59.194 kg)  BMI 21.72 kg/m2  LMP 05/16/2015   BMI: Body mass index is 21.72 kg/(m^2).  General Appearance: Alert, appropriate appearance for age. No acute distress HEENT: Grossly normal Neck / Thyroid:  Cardiovascular: RRR; normal S1, S2, no murmur Lungs: CTA bilaterally Back: No CVAT Breast Exam: No dimpling, nipple retraction or discharge. No masses or nodes., Normal to inspection, Normal breast tissue bilaterally. Gastrointestinal: Soft, non-tender, no masses or organomegaly Pelvic Exam: External genitalia: normal general appearance Urinary system: urethral meatus normal Vaginal: normal mucosa without prolapse or lesions, normal without tenderness, induration or masses and normal rugae Cervix: normal appearance, no polyps seen on exam, healthy cervical mucus Adnexa: normal bimanual exam Uterus: normal single, nontender, retroverted Rectal: not done, hemoccult cards given Rectovaginal: n/a Lymphatic Exam: Non-palpable nodes in neck, clavicular, axillary, or inguinal regions  Skin: no rash or abnormalities Neurologic: Normal gait and speech, no tremor  Psychiatric: Alert and oriented, appropriate affect.  Urinalysis:Not done  Christin BachJohn Scotti Motter. MD Pgr [phone] 4:07 PM   By signing my name below, I, Ronney LionSuzanne Le, attest that this documentation has been prepared under the direction and in the presence of Tilda BurrowJohn Braxon Suder V, MD. Electronically Signed: Ronney LionSuzanne Le, ED Scribe. 05/26/2015. 4:20 PM.  I personally performed the services described in this documentation, which was SCRIBED in my presence. The recorded  information has been reviewed and considered accurate. It has been edited as necessary during review. Tilda Burrow, MD

## 2015-05-31 LAB — CYTOLOGY - PAP

## 2015-06-08 ENCOUNTER — Telehealth: Payer: Self-pay | Admitting: Obstetrics and Gynecology

## 2015-06-08 NOTE — Telephone Encounter (Signed)
Pt called stating that she would like to know the results of her pap. Please contact pt °

## 2015-06-08 NOTE — Telephone Encounter (Signed)
Spoke with pt letting her know her pap was normal. JSY

## 2015-08-24 ENCOUNTER — Other Ambulatory Visit: Payer: Self-pay

## 2015-08-24 MED ORDER — SERTRALINE HCL 100 MG PO TABS: 100.0000 mg | ORAL_TABLET | Freq: Every day | ORAL | Status: DC

## 2016-01-27 ENCOUNTER — Ambulatory Visit: Payer: BLUE CROSS/BLUE SHIELD | Admitting: Family Medicine

## 2016-01-28 ENCOUNTER — Encounter: Payer: Self-pay | Admitting: Family Medicine

## 2016-01-28 ENCOUNTER — Ambulatory Visit (INDEPENDENT_AMBULATORY_CARE_PROVIDER_SITE_OTHER): Payer: BC Managed Care – PPO | Admitting: Family Medicine

## 2016-01-28 VITALS — BP 92/74 | HR 73 | Temp 98.2°F | Ht 64.0 in | Wt 136.8 lb

## 2016-01-28 DIAGNOSIS — B9689 Other specified bacterial agents as the cause of diseases classified elsewhere: Secondary | ICD-10-CM

## 2016-01-28 DIAGNOSIS — J208 Acute bronchitis due to other specified organisms: Secondary | ICD-10-CM | POA: Diagnosis not present

## 2016-01-28 MED ORDER — BENZONATATE 100 MG PO CAPS: 100.0000 mg | ORAL_CAPSULE | Freq: Three times a day (TID) | ORAL | 0 refills | Status: DC | PRN

## 2016-01-28 MED ORDER — AZITHROMYCIN 250 MG PO TABS: ORAL_TABLET | ORAL | 0 refills | Status: DC

## 2016-01-28 NOTE — Progress Notes (Signed)
Pt Pre visit review using our clinic review tool, if applicable. No additional management support is needed unless otherwise documented below in the visit note.

## 2016-01-28 NOTE — Patient Instructions (Signed)
Wait 2 days before taking antibiotic. Do not take if you start feeling better.

## 2016-01-28 NOTE — Progress Notes (Addendum)
Chief Complaint  Patient presents with  . Cough    Pt reports congestion, cough, mild body aches and sore throat     Donna Lucas here for URI complaints.  Duration: 9 days  Associated symptoms: sinus congestion, runny nose, ear pain, sore throat, myalgia and cough  Denies: ear drainage, sinus tenderness, fevers, SOB Treatment to date: Mucinex D  Sick contacts: Yes- teacher, Special Ed  ROS:  Const: Denies fevers HEENT: As noted in HPI Lungs: No SOB  Past Medical History:  Diagnosis Date  . Abnormal CT scan, chest 02-2009   solitary nodule,f/u by pulmonology, neck CT, April 2011  . Anemia   . Anxiety   . Elevated LFTs    after cholecystectomy   Family History  Problem Relation Age of Onset  . Cancer Other     ovarian; breast  . Healthy Son   . Healthy Son   . Healthy Daughter     BP 92/74 (BP Location: Right Arm, Patient Position: Sitting, Cuff Size: Small)   Pulse 73   Temp 98.2 F (36.8 C) (Oral)   Ht 5\' 4"  (1.626 m)   Wt 136 lb 12.8 oz (62.1 kg)   LMP 01/14/2016   SpO2 97%   BMI 23.48 kg/m  General: Awake, alert, appears stated age HEENT: AT, [place], ears patent b/l and TM's neg, nares patent w/o discharge, mildly TTP over R frontal sinus, pharynx pink and without exudates, MMM Neck: No masses or asymmetry Heart: RRR, no murmurs, no bruits Lungs: CTAB, no accessory muscle use Psych: Age appropriate judgment and insight, normal mood and affect  Acute bacterial bronchitis - Plan: benzonatate (TESSALON) 100 MG capsule, azithromycin (ZITHROMAX) 250 MG tablet  Orders as above. Wait 2 days before taking abx as this could just need a few more days. OK to return to work on Mon.  Continue to push fluids and practice good hand hygiene.  F/u in 1 week if symptoms worsen or fail to improve. Pt voiced understanding and agreement to the plan.  Jilda Rocheicholas Paul Gret[place]Wendling, DO 01/28/16 10:43 AM

## 2016-02-29 ENCOUNTER — Telehealth: Payer: Self-pay | Admitting: Internal Medicine

## 2016-02-29 MED ORDER — SERTRALINE HCL 100 MG PO TABS: 100.0000 mg | ORAL_TABLET | Freq: Every day | ORAL | 0 refills | Status: DC

## 2016-02-29 NOTE — Telephone Encounter (Signed)
Rx sent 

## 2016-02-29 NOTE — Telephone Encounter (Signed)
Caller name: Luster LandsbergRenee Relation to pt: self Call back number: [phone] [phone] [place] [id number]  Reason for call: Pt called requesting refill on sertraline (ZOLOFT) 100 MG tablet, pt states has one pill left for today (pt mentioned the holidays threw her off and realized that was her last pill). Please advise ASAP.

## 2016-03-30 ENCOUNTER — Telehealth: Payer: Self-pay | Admitting: Internal Medicine

## 2016-03-30 MED ORDER — SERTRALINE HCL 100 MG PO TABS: 100.0000 mg | ORAL_TABLET | Freq: Every day | ORAL | 0 refills | Status: DC

## 2016-03-30 NOTE — Telephone Encounter (Signed)
Patient asking for refill for Zoloft she has one day left. [facility] [address]

## 2016-03-30 NOTE — Telephone Encounter (Signed)
Pt overdue for appt, 15 day supply sent.

## 2016-04-05 ENCOUNTER — Encounter: Payer: Self-pay | Admitting: Internal Medicine

## 2016-04-05 ENCOUNTER — Ambulatory Visit (INDEPENDENT_AMBULATORY_CARE_PROVIDER_SITE_OTHER): Payer: BC Managed Care – PPO | Admitting: Internal Medicine

## 2016-04-05 VITALS — BP 122/76 | HR 46 | Temp 98.2°F | Resp 12 | Ht 64.0 in | Wt 136.5 lb

## 2016-04-05 DIAGNOSIS — Z0000 Encounter for general adult medical examination without abnormal findings: Secondary | ICD-10-CM | POA: Diagnosis not present

## 2016-04-05 MED ORDER — SERTRALINE HCL 50 MG PO TABS: 50.0000 mg | ORAL_TABLET | Freq: Every day | ORAL | 6 refills | Status: DC

## 2016-04-05 NOTE — Progress Notes (Signed)
Pre visit review using our clinic review tool, if applicable. No additional management support is needed unless otherwise documented below in the visit note. 

## 2016-04-05 NOTE — Patient Instructions (Addendum)
GO TO THE LAB : Get the blood work     GO TO THE FRONT DESK Schedule your next appointment for a physical exam in one year   We can try to wean you off of sertraline.  PLAN: Sertraline 50 mg: 1.5 tablet daily for one month 1 tablet daily for a month Half tablet daily for a month Discontinue sertraline Restart as needed and let me know

## 2016-04-05 NOTE — Progress Notes (Signed)
Subjective:    Patient ID: Donna Lucas, female    DOB: 10/18/1970, 46 y.o.   MRN: [id number]  DOS:  04/05/2016 Type of visit - description : cpx Interval history: Feels great, no concerns    Review of Systems Constitutional: No fever. No chills. No unexplained wt changes. No unusual sweats  HEENT: No dental problems, no ear discharge, no facial swelling, no voice changes. No eye discharge, no eye  redness , no  intolerance to light   Respiratory: No wheezing , no  difficulty breathing. No cough , no mucus production  Cardiovascular: No CP, no leg swelling , no  Palpitations  GI: no nausea, no vomiting, no diarrhea , no  abdominal pain.  No blood in the stools. No dysphagia, no odynophagia    Endocrine: No polyphagia, no polyuria , no polydipsia  GU: No dysuria, gross hematuria, difficulty urinating. No urinary urgency, no frequency. Periods are still heavy  Musculoskeletal: No joint swellings or unusual aches or pains  Skin: No change in the color of the skin, palor , no  Rash  Allergic, immunologic: No environmental allergies , no  food allergies  Neurological: No dizziness no  syncope. No headaches. No diplopia, no slurred, no slurred speech, no motor deficits, no facial  Numbness  Hematological: No enlarged lymph nodes, no easy bruising , no unusual bleedings  Psychiatry: Feels great emotionally. Wean off sertraline?  Past Medical History:  Diagnosis Date  . Abnormal CT scan, chest 02-2009   solitary nodule,f/u by pulmonology, neck CT, April 2011  . Anemia   . Anxiety   . Elevated LFTs    after cholecystectomy    Past Surgical History:  Procedure Laterality Date  . CHOLECYSTECTOMY    . ECTOPIC PREGNANCY SURGERY    . ERCP     2 yrs ago after her GB removed  . OVARIAN CYST REMOVAL      Social History   Social History  . Marital status: Divorced    Spouse name: N/A  . Number of children: 3  . Years of education: N/A   Occupational History  . teacher     Social History Main Topics  . Smoking status: Former Smoker    Types: Cigarettes    Quit date: 05/08/1991  . Smokeless tobacco: Never Used     Comment: greater than 20 yrs ago ,patient smokes less than 1/2 pack a day  . Alcohol use Yes     Comment: wine rarely   . Drug use: No  . Sexual activity: Not on file   Other Topics Concern  . Not on file   Social History Narrative   [place]    Divorce, 3 independent      Family History  Problem Relation Age of Onset  . Cancer Other     ovarian; breast  . Healthy Son   . Healthy Son   . Healthy Daughter   . Colon cancer Neg Hx   . Diabetes Neg Hx   . CAD Neg Hx      Allergies as of 04/05/2016   No Known Allergies     Medication List       Accurate as of 04/05/16 11:59 PM. Always use your most recent med list.          sertraline 50 MG tablet Commonly known as:  ZOLOFT Take 1 tablet (50 mg total) by mouth daily.          Objective:  Physical Exam BP 122/76 (BP Location: Left Arm, Patient Position: Sitting, Cuff Size: Small)   Pulse (!) 46   Temp 98.2 F (36.8 C) (Oral)   Resp 12   Ht 5\' 4"  (1.626 m)   Wt 136 lb 8 oz (61.9 kg)   LMP 03/16/2016 (Approximate)   SpO2 99%   BMI 23.43 kg/m  General:   Well developed, well nourished . NAD.  Neck: No  thyromegaly  HEENT:  Normocephalic . Face symmetric, atraumatic Lungs:  CTA B Normal respiratory effort, no intercostal retractions, no accessory muscle use. Heart: RRR,  no murmur.  No pretibial edema bilaterally  Abdomen:  Not distended, soft, non-tender. No rebound or rigidity.   Skin: Exposed areas without rash. Not pale. Not jaundice Neurologic:  alert & oriented X3.  Speech normal, gait appropriate for age and unassisted Strength symmetric and appropriate for age.  Psych: Cognition and judgment appear intact.  Cooperative with normal attention span and concentration.  Behavior appropriate. No anxious or depressed appearing.      Assessment & Plan:   Assessment> Anxiety H/o Solitary nodule per CT chest, stable for years, last CT 2013 H/o anemia-likely from heavy periods, sees GI  Not on BCP  Plan: Anxiety: Feeling great on sertraline 100 mg. Her life is less stress now since her divorce, wonders if she could wean off sertraline. I agree. Will gradually decrease the dose, see instructions, if she feels that she needs to go back on sertraline that is fine. History of anemia: Check a CBC, periods are still heavy. RTC one year

## 2016-04-05 NOTE — Assessment & Plan Note (Addendum)
Td 2011 Female care per gyn, gets MMG regulalrly CCS: never had a cscope, no FH We'll check a CBC, CMP, A1c, HIV, FLP, TSH Diet and exercise discussed.

## 2016-04-06 LAB — CBC WITH DIFFERENTIAL/PLATELET
BASOS PCT: 0.7 % (ref 0.0–3.0)
Basophils Absolute: 0 10*3/uL (ref 0.0–0.1)
EOS PCT: 0.8 % (ref 0.0–5.0)
Eosinophils Absolute: 0.1 10*3/uL (ref 0.0–0.7)
HEMATOCRIT: 29.9 % — AB (ref 36.0–46.0)
HEMOGLOBIN: 9.5 g/dL — AB (ref 12.0–15.0)
LYMPHS PCT: 40.7 % (ref 12.0–46.0)
Lymphs Abs: 2.7 10*3/uL (ref 0.7–4.0)
MCHC: 31.6 g/dL (ref 30.0–36.0)
MCV: 75 fl — ABNORMAL LOW (ref 78.0–100.0)
MONOS PCT: 10 % (ref 3.0–12.0)
Monocytes Absolute: 0.7 10*3/uL (ref 0.1–1.0)
Neutro Abs: 3.2 10*3/uL (ref 1.4–7.7)
Neutrophils Relative %: 47.8 % (ref 43.0–77.0)
Platelets: 287 10*3/uL (ref 150.0–400.0)
RBC: 3.99 Mil/uL (ref 3.87–5.11)
RDW: 17.5 % — ABNORMAL HIGH (ref 11.5–15.5)
WBC: 6.6 10*3/uL (ref 4.0–10.5)

## 2016-04-06 LAB — LIPID PANEL
Cholesterol: 187 mg/dL (ref 0–200)
HDL: 74.7 mg/dL (ref >39.00)
LDL CALC: 98 mg/dL (ref 0–99)
NONHDL: 112.65
Total CHOL/HDL Ratio: 3
Triglycerides: 75 mg/dL (ref 0.0–149.0)
VLDL: 15 mg/dL (ref 0.0–40.0)

## 2016-04-06 LAB — COMPREHENSIVE METABOLIC PANEL
ALT: 4 U/L (ref 0–35)
AST: 18 U/L (ref 0–37)
Albumin: 4.3 g/dL (ref 3.5–5.2)
Alkaline Phosphatase: 30 U/L — ABNORMAL LOW (ref 39–117)
BUN: 11 mg/dL (ref 6–23)
CALCIUM: 9 mg/dL (ref 8.4–10.5)
CHLORIDE: 106 meq/L (ref 96–112)
CO2: 26 mEq/L (ref 19–32)
Creatinine, Ser: 0.64 mg/dL (ref 0.40–1.20)
GFR: 105.9 mL/min (ref >60.00)
Glucose, Bld: 84 mg/dL (ref 70–99)
POTASSIUM: 3.7 meq/L (ref 3.5–5.1)
SODIUM: 138 meq/L (ref 135–145)
Total Bilirubin: 1.1 mg/dL (ref 0.2–1.2)
Total Protein: 7 g/dL (ref 6.0–8.3)

## 2016-04-06 LAB — HEMOGLOBIN A1C: Hgb A1c MFr Bld: 5.3 % (ref 4.6–6.5)

## 2016-04-06 LAB — HIV ANTIBODY (ROUTINE TESTING W REFLEX): HIV 1&2 Ab, 4th Generation: NONREACTIVE

## 2016-04-06 LAB — TSH: TSH: 1.88 u[IU]/mL (ref 0.35–4.50)

## 2016-04-06 NOTE — Assessment & Plan Note (Signed)
Anxiety: Feeling great on sertraline 100 mg. Her life is less stress now since her divorce, wonders if she could wean off sertraline. I agree. Will gradually decrease the dose, see instructions, if she feels that she needs to go back on sertraline that is fine. History of anemia: Check a CBC, periods are still heavy. RTC one year

## 2016-05-24 ENCOUNTER — Telehealth: Payer: Self-pay | Admitting: Internal Medicine

## 2016-05-24 MED ORDER — SERTRALINE HCL 50 MG PO TABS: 75.0000 mg | ORAL_TABLET | Freq: Every day | ORAL | 5 refills | Status: DC

## 2016-05-24 NOTE — Telephone Encounter (Signed)
Relation to pt: self  Call back number:[phone] [id number] [place], [place] - [id number] [phone] (Phone) [phone])     Reason for call:  Patient states PCP advised to call in if medication wasn't working and MD would increase to 100 MG regarding sertraline (ZOLOFT), patient states pharmacy requesting new Rx and she's completely out,please advise

## 2016-05-24 NOTE — Telephone Encounter (Signed)
Please advise 

## 2016-05-24 NOTE — Telephone Encounter (Signed)
Okay #30, 5 refills 

## 2016-05-24 NOTE — Telephone Encounter (Signed)
Per PCP, requesting I call Pt for clarification. Spoke w/ Pt, she has been taking sertraline 50mg  1.5 tablets daily but has ran out early, she feels great on 75mg . Per PCP okay to send 75mg  to pharmacy.

## 2017-01-31 ENCOUNTER — Other Ambulatory Visit: Payer: Self-pay | Admitting: Internal Medicine

## 2017-02-12 ENCOUNTER — Telehealth: Payer: Self-pay | Admitting: Obstetrics & Gynecology

## 2017-02-12 NOTE — Telephone Encounter (Signed)
Patient's mom called stating that her daughter has  Cyst that has grown back and she needs to be seen today to have it removed. I let pt's mom know we have no appointment for today.~DR

## 2017-02-12 NOTE — Telephone Encounter (Addendum)
This is the mother of a patient whom she was calling about.

## 2017-07-02 ENCOUNTER — Ambulatory Visit: Payer: BC Managed Care – PPO | Admitting: Internal Medicine

## 2017-07-02 ENCOUNTER — Encounter: Payer: Self-pay | Admitting: Internal Medicine

## 2017-07-02 VITALS — BP 116/72 | HR 72 | Temp 98.1°F | Resp 14 | Ht 64.0 in | Wt 133.0 lb

## 2017-07-02 DIAGNOSIS — D508 Other iron deficiency anemias: Secondary | ICD-10-CM

## 2017-07-02 DIAGNOSIS — Z0000 Encounter for general adult medical examination without abnormal findings: Secondary | ICD-10-CM

## 2017-07-02 MED ORDER — FOLIC ACID 1 MG PO TABS: 1.0000 mg | ORAL_TABLET | Freq: Every day | ORAL | 6 refills | Status: DC

## 2017-07-02 NOTE — Progress Notes (Signed)
Pre visit review using our clinic review tool, if applicable. No additional management support is needed unless otherwise documented below in the visit note. 

## 2017-07-02 NOTE — Patient Instructions (Signed)
GO TO THE LAB : Get the blood work     GO TO THE FRONT DESK Schedule your next appointment for a physical exam in 1 year  If you continue to have anemia: We will send a prescription for iron to take twice a day  Also, take folic acid daily, prescription sent, this will help build up your hemoglobin Continue your multivitamin, be sure it contains  B12 .

## 2017-07-02 NOTE — Assessment & Plan Note (Addendum)
-  Td 2011 -Female care per gyn, due for a a biennial  exam, plans to call for a visit and MMG w/ Dr Emelda Fear [facility]) -CCS: never had a cscope, no FH - labs: Reviewed with the patient, will check a BMP, FLP, CBC, iron, ferritin. -Diet and exercise discussed: Reports she is doing great.

## 2017-07-02 NOTE — Progress Notes (Signed)
Subjective:    Patient ID: Donna Lucas, female    DOB: 01/18/1971, 47 y.o.   MRN: [id number]  DOS:  07/02/2017 Type of visit - description : cpx Interval history: Doing great.  No major concerns   Review of Systems Continue with heavy menses.  They come monthly, the first 2 days are very heavy, they last a total of 5 days. Occasional fatigue Not taking SSRIs: Feeling great emotionally.  Other than above, a 14 point review of systems is negative      Past Medical History:  Diagnosis Date  . Abnormal CT scan, chest 02-2009   solitary nodule,f/u by pulmonology, neck CT, April 2011  . Anemia   . Anxiety   . Elevated LFTs    after cholecystectomy    Past Surgical History:  Procedure Laterality Date  . CHOLECYSTECTOMY    . ECTOPIC PREGNANCY SURGERY    . ERCP     2 yrs ago after her GB removed  . OVARIAN CYST REMOVAL      Social History   Socioeconomic History  . Marital status: Divorced    Spouse name: Not on file  . Number of children: 3  . Years of education: Not on file  . Highest education level: Not on file  Occupational History  . Occupation: Optometrist, Tax inspector   Social Needs  . Financial resource strain: Not on file  . Food insecurity:    Worry: Not on file    Inability: Not on file  . Transportation needs:    Medical: Not on file    Non-medical: Not on file  Tobacco Use  . Smoking status: Former Smoker    Types: Cigarettes    Last attempt to quit: 05/08/1991    Years since quitting: 26.1  . Smokeless tobacco: Never Used  . Tobacco comment: greater than 20 yrs ago ,patient smokes less than 1/2 pack a day  Substance and Sexual Activity  . Alcohol use: Yes    Comment: wine rarely   . Drug use: No  . Sexual activity: Not on file  Lifestyle  . Physical activity:    Days per week: Not on file    Minutes per session: Not on file  . Stress: Not on file  Relationships  . Social connections:    Talks on phone: Not on file    Gets  together: Not on file    Attends religious service: Not on file    Active member of club or organization: Not on file    Attends meetings of clubs or organizations: Not on file    Relationship status: Not on file  . Intimate partner violence:    Fear of current or ex partner: Not on file    Emotionally abused: Not on file    Physically abused: Not on file    Forced sexual activity: Not on file  Other Topics Concern  . Not on file  Social History Narrative   Lives in [place]   Divorce, 3 independent    Daughter lives in [place], 1 daughter (born ~ 03/2017)   Son- teacher   Sn lives in [place]     Family History  Problem Relation Age of Onset  . Cancer Other        cousin: ovarian; breast  . Healthy Son   . Healthy Son   . Healthy Daughter   . Colon cancer Neg Hx   . Diabetes Neg Hx   . CAD Neg Hx  Allergies as of 07/02/2017   No Known Allergies     Medication List        Accurate as of 07/02/17 11:59 PM. Always use your most recent med list.          folic acid 1 MG tablet Commonly known as:  FOLVITE Take 1 tablet (1 mg total) by mouth daily.          Objective:   Physical Exam BP 116/72 (BP Location: Right Arm, Patient Position: Sitting, Cuff Size: Small)   Pulse 72   Temp 98.1 F (36.7 C) (Oral)   Resp 14   Ht  (1.626 m)   Wt 133 lb (60.3 kg)   SpO2 96%   BMI 22.83 kg/m  General:   Well developed, well nourished . NAD.  Neck: No  thyromegaly  HEENT:  Normocephalic . Face symmetric, atraumatic Lungs:  CTA B Normal respiratory effort, no intercostal retractions, no accessory muscle use. Heart: RRR,  no murmur.  No pretibial edema bilaterally  Abdomen:  Not distended, soft, non-tender. No rebound or rigidity.   Skin: Exposed areas without rash. Not pale. Not jaundice Neurologic:  alert & oriented X3.  Speech normal, gait appropriate for age and unassisted Strength symmetric and appropriate for age.  Psych: Cognition and judgment  appear intact.  Cooperative with normal attention span and concentration.  Behavior appropriate. No anxious or depressed appearing.     Assessment & Plan:   Assessment Anxiety H/o Solitary nodule per CT chest, stable for years, last CT 2013 H/o anemia-likely from heavy periods, sees GI  Not on BCP  Plan: Anxiety: Resolved, not taking her medications, feeling great  H/O anemia: Chronic, felt to be due to heavy menses.  Currently on MVI.  Recommend to be sure MVI has B12; checking CBC, iron and ferritin.  Recommend to discuss with gynecology heavy menses. RTC 1 year

## 2017-07-03 LAB — LIPID PANEL
CHOL/HDL RATIO: 3
Cholesterol: 201 mg/dL — ABNORMAL HIGH (ref 0–200)
HDL: 79.5 mg/dL (ref >39.00)
LDL CALC: 114 mg/dL — AB (ref 0–99)
NONHDL: 121.16
Triglycerides: 38 mg/dL (ref 0.0–149.0)
VLDL: 7.6 mg/dL (ref 0.0–40.0)

## 2017-07-03 LAB — CBC WITH DIFFERENTIAL/PLATELET
BASOS ABS: 0 10*3/uL (ref 0.0–0.1)
Basophils Relative: 0.6 % (ref 0.0–3.0)
Eosinophils Absolute: 0.1 10*3/uL (ref 0.0–0.7)
Eosinophils Relative: 1.1 % (ref 0.0–5.0)
HCT: 36 % (ref 36.0–46.0)
Hemoglobin: 12.1 g/dL (ref 12.0–15.0)
LYMPHS ABS: 2.8 10*3/uL (ref 0.7–4.0)
Lymphocytes Relative: 43.1 % (ref 12.0–46.0)
MCHC: 33.5 g/dL (ref 30.0–36.0)
MCV: 89.7 fl (ref 78.0–100.0)
MONO ABS: 0.5 10*3/uL (ref 0.1–1.0)
Monocytes Relative: 7.3 % (ref 3.0–12.0)
NEUTROS PCT: 47.9 % (ref 43.0–77.0)
Neutro Abs: 3.1 10*3/uL (ref 1.4–7.7)
Platelets: 296 10*3/uL (ref 150.0–400.0)
RBC: 4.02 Mil/uL (ref 3.87–5.11)
RDW: 15.2 % (ref 11.5–15.5)
WBC: 6.4 10*3/uL (ref 4.0–10.5)

## 2017-07-03 LAB — BASIC METABOLIC PANEL
BUN: 10 mg/dL (ref 6–23)
CO2: 26 meq/L (ref 19–32)
Calcium: 8.9 mg/dL (ref 8.4–10.5)
Chloride: 105 mEq/L (ref 96–112)
Creatinine, Ser: 0.63 mg/dL (ref 0.40–1.20)
GFR: 107.27 mL/min (ref >60.00)
GLUCOSE: 82 mg/dL (ref 70–99)
POTASSIUM: 3.8 meq/L (ref 3.5–5.1)
Sodium: 139 mEq/L (ref 135–145)

## 2017-07-03 LAB — IRON: IRON: 44 ug/dL (ref 42–145)

## 2017-07-03 LAB — FERRITIN: Ferritin: 4.3 ng/mL — ABNORMAL LOW (ref 10.0–291.0)

## 2017-07-03 NOTE — Assessment & Plan Note (Signed)
Anxiety: Resolved, not taking her medications, feeling great  H/O anemia: Chronic, felt to be due to heavy menses.  Currently on MVI.  Recommend to be sure MVI has B12; checking CBC, iron and ferritin.  Recommend to discuss with gynecology heavy menses. RTC 1 year

## 2017-11-26 ENCOUNTER — Other Ambulatory Visit: Payer: Self-pay | Admitting: Obstetrics and Gynecology

## 2017-11-26 DIAGNOSIS — Z1231 Encounter for screening mammogram for malignant neoplasm of breast: Secondary | ICD-10-CM

## 2017-12-07 ENCOUNTER — Ambulatory Visit (HOSPITAL_COMMUNITY): Payer: BC Managed Care – PPO

## 2017-12-11 ENCOUNTER — Telehealth: Payer: Self-pay | Admitting: *Deleted

## 2017-12-11 NOTE — Telephone Encounter (Signed)
Spoke with pt advising we don't have any mammo results on pt. Pt had mammo in [place]. Pt to call where she had mammo and see if they can give her the results or have results sent to [place]. JSY

## 2017-12-26 ENCOUNTER — Encounter: Payer: Self-pay | Admitting: Obstetrics and Gynecology

## 2017-12-26 ENCOUNTER — Ambulatory Visit: Payer: BC Managed Care – PPO | Admitting: Obstetrics and Gynecology

## 2017-12-26 DIAGNOSIS — N939 Abnormal uterine and vaginal bleeding, unspecified: Secondary | ICD-10-CM | POA: Insufficient documentation

## 2017-12-26 MED ORDER — MEDROXYPROGESTERONE ACETATE 10 MG PO TABS: 20.0000 mg | ORAL_TABLET | Freq: Every day | ORAL | 0 refills | Status: DC

## 2017-12-26 NOTE — Progress Notes (Signed)
Ms Donna Lucas present with c/o heavy cycle for the last 10 days. Normally her cycles last @ 5 days. She denies any perimenopausal Sx. H/O cervical polyps in the past. Last pap 1 1/2 yrs ago normal Sexual active without problems. Partner with vasectomy  PE AF VSS Lungs clear  Heart RRR Abd soft + BS GU Nl EGBUS menses noted form cervical os no evidence of polyps, uterus small mobile no masses or tenderness  A/P Vaginal Bleeding Suspect hormonal in nature. Will check labs and U/S. Provera for the bleeding F/U per test results.

## 2017-12-27 ENCOUNTER — Telehealth: Payer: Self-pay | Admitting: *Deleted

## 2017-12-27 ENCOUNTER — Encounter: Payer: Self-pay | Admitting: Internal Medicine

## 2017-12-27 ENCOUNTER — Telehealth: Payer: Self-pay

## 2017-12-27 LAB — CBC
HEMOGLOBIN: 8.8 g/dL — AB (ref 11.1–15.9)
Hematocrit: 26.1 % — ABNORMAL LOW (ref 34.0–46.6)
MCH: 31.1 pg (ref 26.6–33.0)
MCHC: 33.7 g/dL (ref 31.5–35.7)
MCV: 92 fL (ref 79–97)
Platelets: 286 10*3/uL (ref 150–450)
RBC: 2.83 x10E6/uL — AB (ref 3.77–5.28)
RDW: 13.1 % (ref 12.3–15.4)
WBC: 7.1 10*3/uL (ref 3.4–10.8)

## 2017-12-27 LAB — TSH: TSH: 5.16 u[IU]/mL — ABNORMAL HIGH (ref 0.450–4.500)

## 2017-12-27 NOTE — Telephone Encounter (Signed)
Advise patient,  I do see that her thyroid is slightly under working, typically that is not treating with medication, rather labs need to be recheck in few months.  (TSH, T3, T4).  That can be follow-up in few months by her gynecologist or me. Also she has anemia again, likely due to vaginal bleeding.  Recommend to discuss with gynecology.

## 2017-12-27 NOTE — Telephone Encounter (Signed)
Copied from CRM [id number]. Topic: General - Inquiry >> Dec 27, 2017  2:20 PM Donna Lucas, NT wrote: Reason for CRM: Patient is calling and states she received thyroid results at her visit on 12/26/17 with her OBGYN and would like for Dr. Drue Novel to review them.

## 2017-12-27 NOTE — Telephone Encounter (Signed)
Patient informed I discussed with JAG.  Ok for her to take Provera as Dr Alysia Penna is trying stop her bleeding.  Informed she is anemic and to start taking Iron supplement BID.  Also thyroid was elevated and will need repeat when she comes in for U/S.  Patient verbalized understanding with no further questions.

## 2017-12-28 HISTORY — PX: BREAST ENHANCEMENT SURGERY: SHX7

## 2017-12-28 NOTE — Telephone Encounter (Signed)
My Chart message sent

## 2017-12-31 ENCOUNTER — Other Ambulatory Visit: Payer: Self-pay | Admitting: *Deleted

## 2017-12-31 ENCOUNTER — Encounter: Payer: Self-pay | Admitting: *Deleted

## 2017-12-31 MED ORDER — LEVOTHYROXINE SODIUM 50 MCG PO TABS: 50.0000 ug | ORAL_TABLET | Freq: Every day | ORAL | 11 refills | Status: DC

## 2018-01-02 ENCOUNTER — Telehealth: Payer: Self-pay | Admitting: *Deleted

## 2018-01-02 MED ORDER — NORETHINDRONE ACETATE 5 MG PO TABS: 5.0000 mg | ORAL_TABLET | Freq: Every day | ORAL | 0 refills | Status: DC

## 2018-01-02 NOTE — Telephone Encounter (Signed)
Start Aygestin 5 mg po qd.

## 2018-01-02 NOTE — Telephone Encounter (Signed)
Rx to pharmacy

## 2018-01-02 NOTE — Telephone Encounter (Signed)
Pt reports she finished provera 2 days ago. Today she has started bleeding again. She is scheduled for her ultrasound tomorrow. She feels like the bleeding today is more like a normal period but is worried that it will become heavy again. Advised patient that I will send message to Dr. Alysia Penna and let her know what he says.

## 2018-01-02 NOTE — Telephone Encounter (Signed)
Called patient and informed her of rx sent to her pharmacy. No further questions at this time.

## 2018-01-03 ENCOUNTER — Other Ambulatory Visit: Payer: BC Managed Care – PPO

## 2018-01-03 ENCOUNTER — Ambulatory Visit: Payer: BC Managed Care – PPO

## 2018-01-03 DIAGNOSIS — N939 Abnormal uterine and vaginal bleeding, unspecified: Secondary | ICD-10-CM | POA: Diagnosis not present

## 2018-01-03 NOTE — Progress Notes (Signed)
PELVIC US TA/TV: homogenous retroverted uterus,wnl,mult simple nabothian cyst,EEC 15 mm,no endometrial masses visualized, but there appears to be a feeder vessel to the mid portion of the endometrium (image 38,39 and 40),normal ovaries bilat,ovaries appear mobile,no free fluid,no pain during ultrasound,reviewed images w/Dr.Ferguson

## 2018-01-04 ENCOUNTER — Encounter: Payer: Self-pay | Admitting: *Deleted

## 2018-01-04 ENCOUNTER — Telehealth: Payer: Self-pay | Admitting: Obstetrics & Gynecology

## 2018-01-04 NOTE — Telephone Encounter (Signed)
Patient called, stated that she was here yesterday for an ultrasound, she wants to know if she should come in Monday for a pap.  [phone]

## 2018-01-07 ENCOUNTER — Telehealth: Payer: Self-pay | Admitting: Obstetrics and Gynecology

## 2018-01-07 NOTE — Telephone Encounter (Signed)
Spoke with pt letting her know [place] was normal per Dr. Despina Hidden. Advised not to change anything but keep doing what dr recommended. Pt voiced understanding. JSY

## 2018-01-07 NOTE — Telephone Encounter (Signed)
Patient called stating that she would like a call back from the nurse with the results of her Ultrasound. Please contact pt

## 2018-01-07 NOTE — Telephone Encounter (Signed)
Her sonogram is normal

## 2018-01-28 ENCOUNTER — Other Ambulatory Visit: Payer: Self-pay | Admitting: Obstetrics and Gynecology

## 2018-01-28 ENCOUNTER — Telehealth: Payer: Self-pay | Admitting: *Deleted

## 2018-01-28 ENCOUNTER — Telehealth: Payer: Self-pay | Admitting: Obstetrics and Gynecology

## 2018-01-28 NOTE — Telephone Encounter (Signed)
Pt called stating that she had been taking the provera as prescribed and it was helping to control her bleeding. She states that she had surgery last Wednesday and on Thursday she started bleeding again. She is asking whether the surgery caused the bleeding and if she should continue taking the provera. Advised pt, per Dr Despina HiddenEure, that she is more than likely bleeding d/t the surgery and to stop taking the provera as it is likely no longer effective. Advised pt that if bleeding is still an issue after stopping the medication to call and make an appt with Dr Despina HiddenEure. Pt verbalized understanding,

## 2018-01-28 NOTE — Telephone Encounter (Signed)
Pt calling to clarify that reason for discontinuing provera. Pt reassured.

## 2018-01-28 NOTE — Telephone Encounter (Signed)
PT NEEDS TO HAVE A NURSE CALL BACK WITH SOME QUESTIONS

## 2018-03-18 ENCOUNTER — Encounter: Payer: Self-pay | Admitting: Internal Medicine

## 2018-03-18 ENCOUNTER — Ambulatory Visit: Payer: BC Managed Care – PPO | Admitting: Internal Medicine

## 2018-03-18 VITALS — BP 116/68 | HR 69 | Temp 98.2°F | Resp 16 | Ht 64.0 in | Wt 124.5 lb

## 2018-03-18 DIAGNOSIS — D509 Iron deficiency anemia, unspecified: Secondary | ICD-10-CM

## 2018-03-18 DIAGNOSIS — N951 Menopausal and female climacteric states: Secondary | ICD-10-CM | POA: Diagnosis not present

## 2018-03-18 DIAGNOSIS — E039 Hypothyroidism, unspecified: Secondary | ICD-10-CM | POA: Diagnosis not present

## 2018-03-18 DIAGNOSIS — E038 Other specified hypothyroidism: Secondary | ICD-10-CM

## 2018-03-18 DIAGNOSIS — G4452 New daily persistent headache (NDPH): Secondary | ICD-10-CM | POA: Diagnosis not present

## 2018-03-18 LAB — CBC WITH DIFFERENTIAL/PLATELET
BASOS ABS: 0 10*3/uL (ref 0.0–0.1)
Basophils Relative: 1 % (ref 0.0–3.0)
EOS PCT: 1.4 % (ref 0.0–5.0)
Eosinophils Absolute: 0.1 10*3/uL (ref 0.0–0.7)
HCT: 42 % (ref 36.0–46.0)
HEMOGLOBIN: 14.2 g/dL (ref 12.0–15.0)
LYMPHS PCT: 46.8 % — AB (ref 12.0–46.0)
Lymphs Abs: 2.1 10*3/uL (ref 0.7–4.0)
MCHC: 33.9 g/dL (ref 30.0–36.0)
MCV: 94.4 fl (ref 78.0–100.0)
Monocytes Absolute: 0.5 10*3/uL (ref 0.1–1.0)
Monocytes Relative: 10.3 % (ref 3.0–12.0)
NEUTROS PCT: 40.5 % — AB (ref 43.0–77.0)
Neutro Abs: 1.8 10*3/uL (ref 1.4–7.7)
Platelets: 219 10*3/uL (ref 150.0–400.0)
RBC: 4.45 Mil/uL (ref 3.87–5.11)
RDW: 13 % (ref 11.5–15.5)
WBC: 4.5 10*3/uL (ref 4.0–10.5)

## 2018-03-18 LAB — SEDIMENTATION RATE: Sed Rate: 7 mm/hr (ref 0–20)

## 2018-03-18 LAB — T4, FREE: FREE T4: 0.8 ng/dL (ref 0.60–1.60)

## 2018-03-18 LAB — IRON: Iron: 149 ug/dL — ABNORMAL HIGH (ref 42–145)

## 2018-03-18 LAB — FERRITIN: Ferritin: 18.8 ng/mL (ref 10.0–291.0)

## 2018-03-18 LAB — TSH: TSH: 3.53 u[IU]/mL (ref 0.35–4.50)

## 2018-03-18 LAB — T3, FREE: T3, Free: 2.7 pg/mL (ref 2.3–4.2)

## 2018-03-18 MED ORDER — ESCITALOPRAM OXALATE 10 MG PO TABS: 10.0000 mg | ORAL_TABLET | Freq: Every day | ORAL | 5 refills | Status: DC

## 2018-03-18 NOTE — Progress Notes (Signed)
Pre visit review using our clinic review tool, if applicable. No additional management support is needed unless otherwise documented below in the visit note. 

## 2018-03-18 NOTE — Patient Instructions (Addendum)
GO TO THE LAB : Get the blood work     GO TO THE FRONT DESK Schedule your next appointment      Take a OTC multivitamin and start iron over-the-counter. With results more likely will prescribe the iron You must take that every day  For headaches: Rest, drink plenty of fluids, Tylenol or ibuprofen. ER if severe or unusual headache happens.

## 2018-03-18 NOTE — Progress Notes (Signed)
Subjective:    Patient ID: Donna Lucas, female    DOB: [birth year], 48 y.o.   MRN: [id number]  DOS:  03/18/2018 Type of visit - description: Acute, multiple issues A number of symptoms started on April 2019: Periods become irregularly, sometimes extremely heavy, sometimes light.  At some point they lasted 3 weeks.  Has seen gynecology, they both think she has perimenopausal, gynecology recommended Synthroid because the TSH was a slightly elevated but the patient never pursue that because she thinks irregular periods are  due to perimenopausal state.  In addition, she had 2 episodes of headaches in the last 2 weeks: For 5 minutes she saw a couple of spots of light before her eyes, then she experienced the headache, and the forehead and face, worse on the right, lasted few hours. + associated with mild nausea, photo and phonophobia. Not the worst of her life.  Also was told she has abdominal hernia   Review of Systems No neck stiffness Occasionally has palpitations without chest pain, sweats, syncope, palor.  Last few seconds. Complains of dry skin Admits to some anxiety mostly because headaches did interfere with her ADLs.  Past Medical History:  Diagnosis Date  . Abnormal CT scan, chest 02-2009   solitary nodule,f/u by pulmonology, neck CT, April 2011  . Anemia   . Anxiety   . Elevated LFTs    after cholecystectomy    Past Surgical History:  Procedure Laterality Date  . CHOLECYSTECTOMY    . ECTOPIC PREGNANCY SURGERY    . ERCP     2 yrs ago after her GB removed  . OVARIAN CYST REMOVAL      Social History   Socioeconomic History  . Marital status: Divorced    Spouse name: Not on file  . Number of children: 3  . Years of education: Not on file  . Highest education level: Not on file  Occupational History  . Occupation: Optometrist, Tax inspector   Social Needs  . Financial resource strain: Not on file  . Food insecurity:    Worry: Not on file   Inability: Not on file  . Transportation needs:    Medical: Not on file    Non-medical: Not on file  Tobacco Use  . Smoking status: Former Smoker    Types: Cigarettes    Last attempt to quit: 05/08/1991    Years since quitting: 26.8  . Smokeless tobacco: Never Used  . Tobacco comment: greater than 20 yrs ago ,patient smokes less than 1/2 pack a day  Substance and Sexual Activity  . Alcohol use: Yes    Comment: wine rarely   . Drug use: No  . Sexual activity: Yes    Birth control/protection: None  Lifestyle  . Physical activity:    Days per week: Not on file    Minutes per session: Not on file  . Stress: Not on file  Relationships  . Social connections:    Talks on phone: Not on file    Gets together: Not on file    Attends religious service: Not on file    Active member of club or organization: Not on file    Attends meetings of clubs or organizations: Not on file    Relationship status: Not on file  . Intimate partner violence:    Fear of current or ex partner: Not on file    Emotionally abused: Not on file    Physically abused: Not on file    Forced sexual  activity: Not on file  Other Topics Concern  . Not on file  Social History Narrative   Lives in [place]   Divorce, 3 independent    Daughter lives in [place], 1 daughter (born ~ 03/2017)   Son- teacher   Sn lives in [place]      Allergies as of 03/18/2018   No Known Allergies     Medication List       Accurate as of March 18, 2018 10:54 AM. Always use your most recent med list.        levothyroxine 50 MCG tablet Commonly known as:  SYNTHROID Take 1 tablet (50 mcg total) by mouth daily before breakfast.   medroxyPROGESTERone 10 MG tablet Commonly known as:  PROVERA Take 2 tablets (20 mg total) by mouth daily.   norethindrone 5 MG tablet Commonly known as:  AYGESTIN TAKE 1 TABLET BY MOUTH EVERY DAY           Objective:   Physical Exam Abdominal:      BP 116/68 (BP Location: Left  Arm, Patient Position: Sitting, Cuff Size: Normal)   Pulse 69   Temp 98.2 F (36.8 C) (Oral)   Resp 16   Ht 5\' 4"  (1.626 m)   Wt 124 lb 8 oz (56.5 kg)   SpO2 98%   BMI 21.37 kg/m  General:   Well developed, NAD, BMI noted.  HEENT:  Normocephalic . Face symmetric, atraumatic Neck: Supple.  No thyromegaly Lungs:  CTA B Normal respiratory effort, no intercostal retractions, no accessory muscle use. Heart: RRR,  no murmur.  no pretibial edema bilaterally  Abdomen:  Not distended, soft, non-tender. No rebound or rigidity.   Skin: Not pale. Not jaundice Neurologic:  alert & oriented X3.  Speech normal, gait appropriate for age and unassisted.  EOMI, pupils equal and reactive, motor and DTR symmetric. Psych--  Cognition and judgment appear intact.  Cooperative with normal attention span and concentration.  Behavior appropriate. No anxious or depressed appearing.     Assessment     Assessment Anxiety H/o Solitary nodule per CT chest, stable for years, last CT 2013 H/o anemia-likely from heavy periods, sees GI  Not on BCP  Plan: Perimenopausal: Per gynecology H/o Anemia: Likely due to heavy.,  See above, will check iron, ferritin, CBC.  She is supposed to take iron but has not been doing so.  Recommend to start a multivitamin OTC iron.  Likely will prescribe iron with results. Increased TSH: We will recheck TFTs, unclear if symptoms as described above related with a slightly increased TSH, I think they are more related with perimenopausal state. Headache: No history of migraines per se before, hut she did have headaches in the past, many years ago.  On clinical grounds, sxs sound like a migraine.  Given age and relatively new onset of a headache we will get an MRI.  Otherwise recommend rest, fluids and OTCs. Hernia: Declined a referral to surgery, red flag symptoms of incarceration discussed. Anxiety depression: She is stop sertraline around 05-2017, since then is having a number  of symptoms and is a slightly anxious.  We discussed possibly restart SSRIs and we agreed to do so.  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  Prescription sent.   RTC 3 months

## 2018-03-19 ENCOUNTER — Encounter: Payer: Self-pay | Admitting: Internal Medicine

## 2018-03-19 DIAGNOSIS — N951 Menopausal and female climacteric states: Secondary | ICD-10-CM | POA: Insufficient documentation

## 2018-03-19 NOTE — Assessment & Plan Note (Signed)
Perimenopausal: Per gynecology H/o Anemia: Likely due to heavy.,  See above, will check iron, ferritin, CBC.  She is supposed to take iron but has not been doing so.  Recommend to start a multivitamin OTC iron.  Likely will prescribe iron with results. Increased TSH: We will recheck TFTs, unclear if symptoms as described above related with a slightly increased TSH, I think they are more related with perimenopausal state. Headache: No history of migraines per se before, hut she did have headaches in the past, many years ago.  On clinical grounds, sxs sound like a migraine.  Given age and relatively new onset of a headache we will get an MRI.  Otherwise recommend rest, fluids and OTCs. Hernia: Declined a referral to surgery, red flag symptoms of incarceration discussed. Anxiety depression: She is stop sertraline around 05-2017, since then is having a number of symptoms and is a slightly anxious.  We discussed possibly restart SSRIs and we agreed to do so.  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  Prescription sent.   RTC 3 months

## 2018-03-23 ENCOUNTER — Ambulatory Visit (HOSPITAL_BASED_OUTPATIENT_CLINIC_OR_DEPARTMENT_OTHER)
Admission: RE | Admit: 2018-03-23 | Discharge: 2018-03-23 | Disposition: A | Discharge status: Home / Self Care | Payer: BC Managed Care – PPO | Source: Ambulatory Visit | Attending: Internal Medicine | Admitting: Internal Medicine

## 2018-03-23 DIAGNOSIS — G4452 New daily persistent headache (NDPH): Secondary | ICD-10-CM | POA: Diagnosis not present

## 2018-06-10 ENCOUNTER — Telehealth: Payer: Self-pay

## 2018-06-10 NOTE — Telephone Encounter (Signed)
Copied from CRM [id number]. Topic: Appointment Scheduling - Scheduling Inquiry for Clinic >> Jun 07, 2018  4:00 PM Mickel Baas B, [place] wrote: Reason for CRM: Patient calling to schedule 3 month follow up for thyroid. Dr Drue Novel suggested around 06/17/2018. Please advise.

## 2018-06-10 NOTE — Telephone Encounter (Signed)
Donna Lucas- can you call Pt and set up virtual visit at her convenience please?

## 2018-06-10 NOTE — Telephone Encounter (Signed)
Called pt schedule appt with provider on 06-18-2018. [email or web address], confirmed email. Done

## 2018-06-18 ENCOUNTER — Other Ambulatory Visit: Payer: Self-pay

## 2018-06-18 ENCOUNTER — Ambulatory Visit (INDEPENDENT_AMBULATORY_CARE_PROVIDER_SITE_OTHER): Payer: BC Managed Care – PPO | Admitting: Internal Medicine

## 2018-06-18 DIAGNOSIS — F419 Anxiety disorder, unspecified: Secondary | ICD-10-CM

## 2018-06-18 DIAGNOSIS — R002 Palpitations: Secondary | ICD-10-CM

## 2018-06-18 DIAGNOSIS — R519 Headache, unspecified: Secondary | ICD-10-CM

## 2018-06-18 DIAGNOSIS — R51 Headache: Secondary | ICD-10-CM

## 2018-06-18 NOTE — Progress Notes (Signed)
Subjective:    Patient ID: Donna Lucas, female    DOB: [birth year], 48 y.o.   MRN: [id number]  DOS:  06/18/2018 Type of visit - description: Virtual Visit via Video Note  I connected with@ on 06/18/18 at 10:00 AM EDT by a video enabled telemedicine application and verified that I am speaking with the correct person using two identifiers.   THIS ENCOUNTER IS A VIRTUAL VISIT DUE TO COVID-19 - PATIENT WAS NOT SEEN IN THE OFFICE. PATIENT HAS CONSENTED TO VIRTUAL VISIT / TELEMEDICINE VISIT   Location of patient: home  Location of provider: office  I discussed the limitations of evaluation and management by telemedicine and the availability of in person appointments. The patient expressed understanding and agreed to proceed.  History of Present Illness: Follow-up See last visit, was restarted on SSRIs, Lexapro is working well for her. Unfortunately, she lost a sister unexpectedly and of course that was very difficult but she is managing things okay.  Her main concern today is palpitations: They started about a year ago, typically happen during her menstrual period. Symptoms described as on and off fluttering for a few minutes.  No associated with other symptoms.  See review of systems  Headaches: Improve after she started SSRIs.    Review of Systems  Denies chest pain, difficulty breathing. No presyncopal state No diaphoresis Past Medical History:  Diagnosis Date  . Abnormal CT scan, chest 02-2009   solitary nodule,f/u by pulmonology, neck CT, April 2011  . Anemia   . Anxiety   . Elevated LFTs    after cholecystectomy    Past Surgical History:  Procedure Laterality Date  . BREAST ENHANCEMENT SURGERY Bilateral 12/2017  . CHOLECYSTECTOMY    . ECTOPIC PREGNANCY SURGERY    . ERCP     2 yrs ago after her GB removed  . OVARIAN CYST REMOVAL      Social History   Socioeconomic History  . Marital status: Divorced    Spouse name: Not on file  . Number of children: 3  .  Years of education: Not on file  . Highest education level: Not on file  Occupational History  . Occupation: Optometrist, Tax inspector   Social Needs  . Financial resource strain: Not on file  . Food insecurity:    Worry: Not on file    Inability: Not on file  . Transportation needs:    Medical: Not on file    Non-medical: Not on file  Tobacco Use  . Smoking status: Former Smoker    Types: Cigarettes    Last attempt to quit: 05/08/1991    Years since quitting: 27.1  . Smokeless tobacco: Never Used  . Tobacco comment: greater than 20 yrs ago ,patient smokes less than 1/2 pack a day  Substance and Sexual Activity  . Alcohol use: Yes    Comment: wine rarely   . Drug use: No  . Sexual activity: Yes    Birth control/protection: None  Lifestyle  . Physical activity:    Days per week: Not on file    Minutes per session: Not on file  . Stress: Not on file  Relationships  . Social connections:    Talks on phone: Not on file    Gets together: Not on file    Attends religious service: Not on file    Active member of club or organization: Not on file    Attends meetings of clubs or organizations: Not on file    Relationship  status: Not on file  . Intimate partner violence:    Fear of current or ex partner: Not on file    Emotionally abused: Not on file    Physically abused: Not on file    Forced sexual activity: Not on file  Other Topics Concern  . Not on file  Social History Narrative   Lives in [place]   Divorce, 3 independent    Daughter lives in [place], 1 daughter (born ~ 03/2017)   Son- teacher   Sn lives in [place]      Allergies as of 06/18/2018   No Known Allergies     Medication List       Accurate as of June 18, 2018 10:01 AM. Always use your most recent med list.        escitalopram 10 MG tablet Commonly known as:  Lexapro Take 1 tablet (10 mg total) by mouth daily.           Objective:   Physical Exam There were no vitals taken  for this visit.    This is a virtual video visit, alert oriented x3, no apparent distress  Assessment      Assessment Anxiety H/o Solitary nodule per CT chest, stable for years, last CT 2013 H/o anemia-likely from heavy periods, sees GI  Not on BCP  Plan: History of anemia: Last labs satisfactory Increased TSH: See last visit, TSH was normalizing. Headache: See last visit, brain MRI negative, symptoms decreasing since SSRIs started Anxiety depression: Well-controlled, started Lexapro few months ago. Palpitations: This is the patient's main concern, this is a video visit, [place].  Symptoms happen only during her menstrual periods,  unclear why.  No anxiety. Needs further evaluation, we agreed [place] [facility] [place]. RTC 6 months, okay to do virtual visit     I discussed the assessment and treatment plan with the patient. The patient was provided an opportunity to ask questions and all were answered. The patient agreed with the plan and demonstrated an understanding of the instructions.   The patient was advised to call back or seek an in-person evaluation if the symptoms worsen or if the condition fails to improve as anticipated.

## 2018-06-19 NOTE — Assessment & Plan Note (Signed)
History of anemia: Last labs satisfactory Increased TSH: See last visit, TSH was normalizing. Headache: See last visit, brain MRI negative, symptoms decreasing since SSRIs started Anxiety depression: Well-controlled, started Lexapro few months ago. Palpitations: This is the patient's main concern, this is a video visit, she lives in [facility] [place].  Symptoms happen only during her menstrual periods,  unclear why.  No anxiety. Needs further evaluation, we agreed to refer to cardiology in [facility]. RTC 6 months, okay to do virtual visit

## 2018-06-27 DIAGNOSIS — Z8241 Family history of sudden cardiac death: Secondary | ICD-10-CM | POA: Insufficient documentation

## 2018-06-28 ENCOUNTER — Other Ambulatory Visit: Payer: Self-pay | Admitting: Internal Medicine

## 2018-11-15 ENCOUNTER — Other Ambulatory Visit: Payer: Self-pay

## 2018-11-15 ENCOUNTER — Ambulatory Visit: Payer: BC Managed Care – PPO | Admitting: Internal Medicine

## 2018-11-15 VITALS — BP 108/70 | HR 73 | Temp 97.9°F | Resp 16 | Ht 64.0 in | Wt 129.0 lb

## 2018-11-15 DIAGNOSIS — F419 Anxiety disorder, unspecified: Secondary | ICD-10-CM | POA: Diagnosis not present

## 2018-11-15 DIAGNOSIS — Z23 Encounter for immunization: Secondary | ICD-10-CM

## 2018-11-15 DIAGNOSIS — R7989 Other specified abnormal findings of blood chemistry: Secondary | ICD-10-CM | POA: Diagnosis not present

## 2018-11-15 DIAGNOSIS — S0990XD Unspecified injury of head, subsequent encounter: Secondary | ICD-10-CM

## 2018-11-15 LAB — T4, FREE: Free T4: 0.59 ng/dL — ABNORMAL LOW (ref 0.60–1.60)

## 2018-11-15 LAB — TSH: TSH: 7.79 u[IU]/mL — ABNORMAL HIGH (ref 0.35–4.50)

## 2018-11-15 MED ORDER — ESCITALOPRAM OXALATE 10 MG PO TABS: 10.0000 mg | ORAL_TABLET | Freq: Every day | ORAL | 2 refills | Status: DC

## 2018-11-15 NOTE — Progress Notes (Signed)
Subjective:    Patient ID: Donna Lucas, female    DOB: 08/21/1970, 48 y.o.   MRN: [id number]  DOS:  11/15/2018 Type of visit - description: ER follow-up  On 11/04/2018, she was at home unloading the dishwasher, when she hit her head with a cabinet door, no bleeding, no LOC but she developed severe pain at the site of the impact. Later on that day she developed some nausea At night she developed tunnel vision and she almost fainted. 911 was called, she then went to the emergency room: Reports reviewed Neurological exam was basically intact CT head with no acute abnormality    Review of Systems Since she left the ER she is doing better Still has mild headache, pressure-like, frontal. Has occasional ringing in the ears Occasionally mild dizziness. No further presyncope feeling. No diplopia. Denies any runny nose, sore throat, sneezing.   Past Medical History:  Diagnosis Date  . Abnormal CT scan, chest 02-2009   solitary nodule,f/u by pulmonology, neck CT, April 2011  . Anemia   . Anxiety   . Elevated LFTs    after cholecystectomy    Past Surgical History:  Procedure Laterality Date  . BREAST ENHANCEMENT SURGERY Bilateral 12/2017  . CHOLECYSTECTOMY    . ECTOPIC PREGNANCY SURGERY    . ERCP     2 yrs ago after her GB removed  . OVARIAN CYST REMOVAL      Social History   Socioeconomic History  . Marital status: Divorced    Spouse name: Not on file  . Number of children: 3  . Years of education: Not on file  . Highest education level: Not on file  Occupational History  . Occupation: Optometristteacher,masters, Tax inspectorcounselor teacher   Social Needs  . Financial resource strain: Not on file  . Food insecurity    Worry: Not on file    Inability: Not on file  . Transportation needs    Medical: Not on file    Non-medical: Not on file  Tobacco Use  . Smoking status: Former Smoker    Types: Cigarettes    Quit date: 05/08/1991    Years since quitting: 27.5  . Smokeless tobacco:  Never Used  . Tobacco comment: greater than 20 yrs ago ,patient smokes less than 1/2 pack a day  Substance and Sexual Activity  . Alcohol use: Yes    Comment: wine rarely   . Drug use: No  . Sexual activity: Yes    Birth control/protection: None  Lifestyle  . Physical activity    Days per week: Not on file    Minutes per session: Not on file  . Stress: Not on file  Relationships  . Social Musicianconnections    Talks on phone: Not on file    Gets together: Not on file    Attends religious service: Not on file    Active member of club or organization: Not on file    Attends meetings of clubs or organizations: Not on file    Relationship status: Not on file  . Intimate partner violence    Fear of current or ex partner: Not on file    Emotionally abused: Not on file    Physically abused: Not on file    Forced sexual activity: Not on file  Other Topics Concern  . Not on file  Social History Narrative   Lives in [place]   Divorce, 3 independent    Daughter lives in [place], 1 daughter (born ~ 03/2017)  Son- teacher   Sn lives in [place] as of 11/15/2018      Reactions   Pollen Extract       Medication List       Accurate as of November 15, 2018 12:09 PM. If you have any questions, ask your nurse or doctor.        cetirizine 10 MG tablet Commonly known as: ZYRTEC Take by mouth.   escitalopram 10 MG tablet Commonly known as: LEXAPRO Take 1 tablet (10 mg total) by mouth daily.   omeprazole 20 MG tablet Commonly known as: PRILOSEC OTC Take 20 mg by mouth daily.           Objective:   Physical Exam BP 108/70 (BP Location: Left Arm, Patient Position: Sitting, Cuff Size: Normal)   Pulse 73   Temp 97.9 F (36.6 C) (Temporal)   Resp 16   Ht 5\' 4"  (1.626 m)   Wt 129 lb (58.5 kg)   SpO2 98%   BMI 22.14 kg/m  General:   Well developed, NAD, BMI noted. HEENT:  Normocephalic . Face symmetric, atraumatic Neck: Full range of motion Head: No  hematoma at the area of the impact near the forehead Lungs:  CTA B Normal respiratory effort, no intercostal retractions, no accessory muscle use. Heart: RRR,  no murmur.  No pretibial edema bilaterally  Skin: Not pale. Not jaundice Neurologic:  alert & oriented X3.  Speech normal, gait appropriate for age and unassisted. EOMI, pupils equal and reactive.  Face symmetric, tongue midline Psych--  Cognition and judgment appear intact.  Cooperative with normal attention span and concentration.  Behavior appropriate. No anxious or depressed appearing.      Assessment     Assessment Anxiety H/o Solitary nodule per CT chest, stable for years, last CT 2013 H/o anemia-likely from heavy periods, sees GI  Not on BCP  Plan: Head concussion: as described above, she still has mild symptoms, neurological exam remains symmetric.  No red flag symptoms. She had good information about head concussion precautions from the ER, recommend to continue observing them.  Call if not back to normal in 2 to 3 weeks. Increased TSH: Request labs, will check a TSH and T4 Anxiety depression: Controlled, refill Lexapro Preventive care: Flu shot today.  Recommend to see gynecology RTC 8 months

## 2018-11-15 NOTE — Patient Instructions (Signed)
GO TO THE LAB : Get the blood work     GO TO THE FRONT DESK Schedule your next appointment for a checkup in 8 months     Please continue observing the concussion precautions  Call if you are not gradually better

## 2018-11-16 NOTE — Assessment & Plan Note (Signed)
Head concussion: as described above, she still has mild symptoms, neurological exam remains symmetric.  No red flag symptoms. She had good information about head concussion precautions from the ER, recommend to continue observing them.  Call if not back to normal in 2 to 3 weeks. Increased TSH: Request labs, will check a TSH and T4 Anxiety depression: Controlled, refill Lexapro Preventive care: Flu shot today.  Recommend to see gynecology RTC 8 months

## 2018-11-18 ENCOUNTER — Encounter: Payer: Self-pay | Admitting: Internal Medicine

## 2018-11-18 MED ORDER — LEVOTHYROXINE SODIUM 50 MCG PO TABS: 50.0000 ug | ORAL_TABLET | Freq: Every day | ORAL | 2 refills | Status: DC

## 2018-12-09 ENCOUNTER — Ambulatory Visit (INDEPENDENT_AMBULATORY_CARE_PROVIDER_SITE_OTHER): Payer: BC Managed Care – PPO | Admitting: Internal Medicine

## 2018-12-09 ENCOUNTER — Other Ambulatory Visit: Payer: Self-pay

## 2018-12-09 ENCOUNTER — Telehealth: Payer: Self-pay | Admitting: Internal Medicine

## 2018-12-09 DIAGNOSIS — E039 Hypothyroidism, unspecified: Secondary | ICD-10-CM

## 2018-12-09 DIAGNOSIS — E038 Other specified hypothyroidism: Secondary | ICD-10-CM

## 2018-12-09 NOTE — Telephone Encounter (Signed)
Called pt to [facility] 3 week lab no answer mailbox was full will try to call again

## 2018-12-09 NOTE — Progress Notes (Signed)
Subjective:    Patient ID: Donna Lucas, female    DOB: 1970/03/08, 48 y.o.   MRN: [id number]  DOS:  12/09/2018 Type of visit - description: Virtual Visit via Video Note  I connected with@   by a video enabled telemedicine application and verified that I am speaking with the correct person using two identifiers.   THIS ENCOUNTER IS A VIRTUAL VISIT DUE TO COVID-19 - PATIENT WAS NOT SEEN IN THE OFFICE. PATIENT HAS CONSENTED TO VIRTUAL VISIT / TELEMEDICINE VISIT   Location of patient: home  Location of provider: office  I discussed the limitations of evaluation and management by telemedicine and the availability of in person appointments. The patient expressed understanding and agreed to proceed.  History of Present Illness: Follow-up: Recently started Synthroid, 50 mcg, she felt that it was too much: Felt palpitations, nervous, "like I had to jump out of my skin". She is now taking half tablet and feels well.   Review of Systems No nausea, vomiting, diarrhea. Has noticed some irregularity of her periods  Past Medical History:  Diagnosis Date  . Abnormal CT scan, chest 02-2009   solitary nodule,f/u by pulmonology, neck CT, April 2011  . Anemia   . Anxiety   . Elevated LFTs    after cholecystectomy    Past Surgical History:  Procedure Laterality Date  . BREAST ENHANCEMENT SURGERY Bilateral 12/2017  . CHOLECYSTECTOMY    . ECTOPIC PREGNANCY SURGERY    . ERCP     2 yrs ago after her GB removed  . OVARIAN CYST REMOVAL      Social History   Socioeconomic History  . Marital status: Divorced    Spouse name: Not on file  . Number of children: 3  . Years of education: Not on file  . Highest education level: Not on file  Occupational History  . Occupation: Optometrist, Tax inspector   Social Needs  . Financial resource strain: Not on file  . Food insecurity    Worry: Not on file    Inability: Not on file  . Transportation needs    Medical: Not on file   Non-medical: Not on file  Tobacco Use  . Smoking status: Former Smoker    Types: Cigarettes    Quit date: 05/08/1991    Years since quitting: 27.6  . Smokeless tobacco: Never Used  . Tobacco comment: greater than 20 yrs ago ,patient smokes less than 1/2 pack a day  Substance and Sexual Activity  . Alcohol use: Yes    Comment: wine rarely   . Drug use: No  . Sexual activity: Yes    Birth control/protection: None  Lifestyle  . Physical activity    Days per week: Not on file    Minutes per session: Not on file  . Stress: Not on file  Relationships  . Social Musician on phone: Not on file    Gets together: Not on file    Attends religious service: Not on file    Active member of club or organization: Not on file    Attends meetings of clubs or organizations: Not on file    Relationship status: Not on file  . Intimate partner violence    Fear of current or ex partner: Not on file    Emotionally abused: Not on file    Physically abused: Not on file    Forced sexual activity: Not on file  Other Topics Concern  . Not on file  Social History Narrative   Lives in [place]   Divorce, 3 independent    Daughter lives in [place], 1 daughter (born ~ 03/2017)   Son- teacher   Sn lives in [place] as of 12/09/2018      Reactions   Pollen Extract       Medication List       Accurate as of December 09, 2018  1:07 PM. If you have any questions, ask your nurse or doctor.        cetirizine 10 MG tablet Commonly known as: ZYRTEC Take by mouth.   escitalopram 10 MG tablet Commonly known as: LEXAPRO Take 1 tablet (10 mg total) by mouth daily.   levothyroxine 50 MCG tablet Commonly known as: SYNTHROID Take 1 tablet (50 mcg total) by mouth daily before breakfast.   omeprazole 20 MG tablet Commonly known as: PRILOSEC OTC Take 20 mg by mouth daily.           Objective:   Physical Exam There were no vitals taken for this visit. This is a  virtual video visit.  She is alert oriented x3, no apparent distress    Assessment      Assessment Anxiety H/o Solitary nodule per CT chest, stable for years, last CT 2013 H/o anemia-likely from heavy periods, sees GI  Not on BCP  Plan: Hypothyroidism: Started Synthroid 50 mcg about 3 weeks ago, could not tolerate 50 mg, now on 25 mcg daily. Plan: Check a TSH, T4 in 3 weeks. Adjust medication dose with results

## 2018-12-09 NOTE — Telephone Encounter (Signed)
-----   Message from Colon Branch, MD sent at 12/09/2018  1:16 PM EDT ----- Regarding: Labs, appointment [facility], please enter orders for a TSH and T4 to be done in 3 weeksSherri, please call the patient and schedule a lab appointment in 3 weeks from today

## 2018-12-10 NOTE — Assessment & Plan Note (Signed)
Hypothyroidism: Started Synthroid 50 mcg about 3 weeks ago, could not tolerate 50 mg, now on 25 mcg daily. Plan: Check a TSH, T4 in 3 weeks. Adjust medication dose with results

## 2018-12-31 ENCOUNTER — Other Ambulatory Visit: Payer: BC Managed Care – PPO

## 2019-01-14 ENCOUNTER — Other Ambulatory Visit: Payer: BC Managed Care – PPO

## 2019-01-17 ENCOUNTER — Other Ambulatory Visit: Payer: Self-pay

## 2019-01-17 ENCOUNTER — Other Ambulatory Visit (INDEPENDENT_AMBULATORY_CARE_PROVIDER_SITE_OTHER): Payer: BC Managed Care – PPO

## 2019-01-17 DIAGNOSIS — E038 Other specified hypothyroidism: Secondary | ICD-10-CM

## 2019-01-17 DIAGNOSIS — E039 Hypothyroidism, unspecified: Secondary | ICD-10-CM | POA: Diagnosis not present

## 2019-01-17 LAB — TSH: TSH: 2.94 u[IU]/mL (ref 0.35–4.50)

## 2019-01-17 LAB — T4, FREE: Free T4: 0.97 ng/dL (ref 0.60–1.60)

## 2019-01-20 MED ORDER — LEVOTHYROXINE SODIUM 50 MCG PO TABS: 25.0000 ug | ORAL_TABLET | Freq: Every day | ORAL | 6 refills | Status: DC

## 2019-01-20 NOTE — Addendum Note (Signed)
Addended byDamita Dunnings D on: 01/20/2019 08:02 AM   Modules accepted: Orders

## 2019-02-19 ENCOUNTER — Telehealth: Payer: Self-pay | Admitting: Internal Medicine

## 2019-02-19 DIAGNOSIS — E039 Hypothyroidism, unspecified: Secondary | ICD-10-CM

## 2019-02-19 MED ORDER — LEVOTHYROXINE SODIUM 50 MCG PO TABS: 50.0000 ug | ORAL_TABLET | Freq: Every day | ORAL | 1 refills | Status: DC

## 2019-02-19 NOTE — Telephone Encounter (Signed)
Spoke w/ Pt- informed of recommendations. Updated Rx sent. Lab appt scheduled 03/26/2019 at 3pm. TSH ordered.

## 2019-02-19 NOTE — Telephone Encounter (Signed)
I was under the impression she was taking half tablet a day. It is okay to continue 1 tablet daily however I would like to check her TSH in January, please arrange.

## 2019-02-19 NOTE — Telephone Encounter (Signed)
Spoke w/ Pt- she is currently taking a full tablet of 32mcg instead of 1/2 tablet by mistake- she is actually feeling better taking the full tablet instead of the 1/2 tablet. Wonders if she can continue taking the full 63mcg tablet because she only has 4 tablets remaining and will need Rx updated where she was taking them wrong.

## 2019-02-19 NOTE — Telephone Encounter (Signed)
Patient called and would like to talk to Dr. Larose Kells or his CMA about her medication levothyroxine (SYNTHROID) 50 MCG tablet. She took more then what she was instructed and is running out. She wants to know what to do about the issue, please call patient back, thanks.

## 2019-03-18 ENCOUNTER — Encounter: Payer: Self-pay | Admitting: Internal Medicine

## 2019-03-18 DIAGNOSIS — R232 Flushing: Secondary | ICD-10-CM

## 2019-03-20 ENCOUNTER — Other Ambulatory Visit: Payer: Self-pay

## 2019-03-20 ENCOUNTER — Other Ambulatory Visit (INDEPENDENT_AMBULATORY_CARE_PROVIDER_SITE_OTHER): Payer: BC Managed Care – PPO

## 2019-03-20 DIAGNOSIS — E039 Hypothyroidism, unspecified: Secondary | ICD-10-CM | POA: Diagnosis not present

## 2019-03-20 DIAGNOSIS — R232 Flushing: Secondary | ICD-10-CM

## 2019-03-20 LAB — CBC WITH DIFFERENTIAL/PLATELET
Basophils Absolute: 0.1 10*3/uL (ref 0.0–0.1)
Basophils Relative: 0.9 % (ref 0.0–3.0)
Eosinophils Absolute: 0.1 10*3/uL (ref 0.0–0.7)
Eosinophils Relative: 1.1 % (ref 0.0–5.0)
HCT: 39.1 % (ref 36.0–46.0)
Hemoglobin: 12.6 g/dL (ref 12.0–15.0)
Lymphocytes Relative: 41.6 % (ref 12.0–46.0)
Lymphs Abs: 2.4 10*3/uL (ref 0.7–4.0)
MCHC: 32.3 g/dL (ref 30.0–36.0)
MCV: 83.9 fl (ref 78.0–100.0)
Monocytes Absolute: 0.4 10*3/uL (ref 0.1–1.0)
Monocytes Relative: 7.2 % (ref 3.0–12.0)
Neutro Abs: 2.8 10*3/uL (ref 1.4–7.7)
Neutrophils Relative %: 49.2 % (ref 43.0–77.0)
Platelets: 284 10*3/uL (ref 150.0–400.0)
RBC: 4.66 Mil/uL (ref 3.87–5.11)
RDW: 15.6 % — ABNORMAL HIGH (ref 11.5–15.5)
WBC: 5.7 10*3/uL (ref 4.0–10.5)

## 2019-03-20 LAB — TSH: TSH: 2.32 u[IU]/mL (ref 0.35–4.50)

## 2019-03-26 ENCOUNTER — Other Ambulatory Visit: Payer: BC Managed Care – PPO

## 2019-03-31 LAB — HM PAP SMEAR: HM Pap smear: NORMAL

## 2019-04-18 ENCOUNTER — Other Ambulatory Visit: Payer: Self-pay | Admitting: Internal Medicine

## 2019-04-23 ENCOUNTER — Encounter: Payer: Self-pay | Admitting: Internal Medicine

## 2019-05-13 ENCOUNTER — Encounter: Payer: Self-pay | Admitting: Internal Medicine

## 2019-05-26 ENCOUNTER — Telehealth: Payer: Self-pay

## 2019-08-28 HISTORY — PX: SUPRA-UMBILICAL HERNIA: SHX6105

## 2019-10-22 ENCOUNTER — Encounter: Payer: Self-pay | Admitting: Internal Medicine

## 2019-12-22 ENCOUNTER — Encounter: Payer: Self-pay | Admitting: Internal Medicine

## 2019-12-23 MED ORDER — ESCITALOPRAM OXALATE 10 MG PO TABS: 10.0000 mg | ORAL_TABLET | Freq: Every day | ORAL | 0 refills | Status: DC

## 2019-12-23 MED ORDER — LEVOTHYROXINE SODIUM 50 MCG PO TABS: 50.0000 ug | ORAL_TABLET | Freq: Every day | ORAL | 0 refills | Status: DC

## 2020-01-08 ENCOUNTER — Ambulatory Visit: Payer: BC Managed Care – PPO | Admitting: Internal Medicine

## 2020-01-08 ENCOUNTER — Other Ambulatory Visit: Payer: Self-pay

## 2020-01-08 ENCOUNTER — Encounter: Payer: Self-pay | Admitting: Internal Medicine

## 2020-01-08 VITALS — BP 117/81 | HR 68 | Temp 98.2°F | Resp 16 | Ht 64.0 in | Wt 135.4 lb

## 2020-01-08 DIAGNOSIS — E039 Hypothyroidism, unspecified: Secondary | ICD-10-CM

## 2020-01-08 DIAGNOSIS — Z23 Encounter for immunization: Secondary | ICD-10-CM | POA: Diagnosis not present

## 2020-01-08 DIAGNOSIS — Z0000 Encounter for general adult medical examination without abnormal findings: Secondary | ICD-10-CM

## 2020-01-08 LAB — LIPID PANEL
Cholesterol: 195 mg/dL (ref 0–200)
HDL: 80.5 mg/dL (ref >39.00)
LDL Cholesterol: 101 mg/dL — ABNORMAL HIGH (ref 0–99)
NonHDL: 114.46
Total CHOL/HDL Ratio: 2
Triglycerides: 66 mg/dL (ref 0.0–149.0)
VLDL: 13.2 mg/dL (ref 0.0–40.0)

## 2020-01-08 LAB — TSH: TSH: 1.78 u[IU]/mL (ref 0.35–4.50)

## 2020-01-08 LAB — CBC WITH DIFFERENTIAL/PLATELET
Basophils Absolute: 0.1 10*3/uL (ref 0.0–0.1)
Basophils Relative: 0.9 % (ref 0.0–3.0)
Eosinophils Absolute: 0.1 10*3/uL (ref 0.0–0.7)
Eosinophils Relative: 0.7 % (ref 0.0–5.0)
HCT: 38.9 % (ref 36.0–46.0)
Hemoglobin: 12.9 g/dL (ref 12.0–15.0)
Lymphocytes Relative: 29.2 % (ref 12.0–46.0)
Lymphs Abs: 2.3 10*3/uL (ref 0.7–4.0)
MCHC: 33.3 g/dL (ref 30.0–36.0)
MCV: 92.9 fl (ref 78.0–100.0)
Monocytes Absolute: 0.5 10*3/uL (ref 0.1–1.0)
Monocytes Relative: 6.4 % (ref 3.0–12.0)
Neutro Abs: 4.9 10*3/uL (ref 1.4–7.7)
Neutrophils Relative %: 62.8 % (ref 43.0–77.0)
Platelets: 264 10*3/uL (ref 150.0–400.0)
RBC: 4.18 Mil/uL (ref 3.87–5.11)
RDW: 15.2 % (ref 11.5–15.5)
WBC: 7.8 10*3/uL (ref 4.0–10.5)

## 2020-01-08 LAB — COMPREHENSIVE METABOLIC PANEL
ALT: 5 U/L (ref 0–35)
AST: 16 U/L (ref 0–37)
Albumin: 4 g/dL (ref 3.5–5.2)
Alkaline Phosphatase: 27 U/L — ABNORMAL LOW (ref 39–117)
BUN: 14 mg/dL (ref 6–23)
CO2: 28 mEq/L (ref 19–32)
Calcium: 8.7 mg/dL (ref 8.4–10.5)
Chloride: 102 mEq/L (ref 96–112)
Creatinine, Ser: 0.83 mg/dL (ref 0.40–1.20)
GFR: 82.24 mL/min (ref >60.00)
Glucose, Bld: 80 mg/dL (ref 70–99)
Potassium: 4.4 mEq/L (ref 3.5–5.1)
Sodium: 136 mEq/L (ref 135–145)
Total Bilirubin: 1.1 mg/dL (ref 0.2–1.2)
Total Protein: 6.5 g/dL (ref 6.0–8.3)

## 2020-01-08 LAB — HEMOGLOBIN A1C: Hgb A1c MFr Bld: 5.5 % (ref 4.6–6.5)

## 2020-01-08 NOTE — Progress Notes (Signed)
Subjective:    Patient ID: Donna Lucas, female    DOB: 08/22/1970, 49 y.o.   MRN: [id number]  DOS:  01/08/2020 Type of visit - description: CPX  Since the last office visit she is doing well. She noticed that her hair is somewhat thin and her eyes are little puffy, wonders if her thyroid has to do with it.   Review of Systems  Other than above, a 14 point review of systems is negative    Past Medical History:  Diagnosis Date  . Abnormal CT scan, chest 02-2009   solitary nodule,f/u by pulmonology, neck CT, April 2011  . Anemia   . Anxiety   . Elevated LFTs    after cholecystectomy    Past Surgical History:  Procedure Laterality Date  . BREAST ENHANCEMENT SURGERY Bilateral 12/2017  . CHOLECYSTECTOMY    . ECTOPIC PREGNANCY SURGERY    . ERCP     2 yrs ago after her GB removed  . OVARIAN CYST REMOVAL    . SUPRA-UMBILICAL HERNIA  08/2019    Allergies as of 01/08/2020      Reactions   Pollen Extract       Medication List       Accurate as of January 08, 2020 11:59 PM. If you have any questions, ask your nurse or doctor.        STOP taking these medications   omeprazole 20 MG capsule Commonly known as: PRILOSEC Stopped by: Willow Ora, MD     TAKE these medications   cetirizine 10 MG tablet Commonly known as: ZYRTEC Take by mouth.   escitalopram 10 MG tablet Commonly known as: LEXAPRO Take 1 tablet (10 mg total) by mouth daily.   ethynodiol-ethinyl estradiol 1-35 MG-MCG tablet Commonly known as: ZOVIA Take by mouth.   levothyroxine 50 MCG tablet Commonly known as: Synthroid Take 1 tablet (50 mcg total) by mouth daily before breakfast.   omeprazole 20 MG tablet Commonly known as: PRILOSEC OTC Take 20 mg by mouth daily.   Vitamin D 50 MCG (2000 UT) tablet Take 2,000 Units by mouth daily.          Objective:   Physical Exam BP 117/81 (BP Location: Left Arm, Patient Position: Sitting, Cuff Size: Small)   Pulse 68   Temp 98.2 F (36.8 C)  (Oral)   Resp 16   Ht 5\' 4"  (1.626 m)   Wt 135 lb 6 oz (61.4 kg)   SpO2 99%   BMI 23.24 kg/m  General: Well developed, NAD, BMI noted Neck: No  thyromegaly  HEENT:  Normocephalic . Face symmetric, atraumatic Lungs:  CTA B Normal respiratory effort, no intercostal retractions, no accessory muscle use. Heart: RRR,  no murmur.  Abdomen:  Not distended, soft, non-tender. No rebound or rigidity.   Lower extremities: no pretibial edema bilaterally  Skin: Exposed areas without rash. Not pale. Not jaundice Neurologic:  alert & oriented X3.  Speech normal, gait appropriate for age and unassisted Strength symmetric and appropriate for age.  Psych: Cognition and judgment appear intact.  Cooperative with normal attention span and concentration.  Behavior appropriate. No anxious or depressed appearing.     Assessment     Assessment Anxiety H/o Solitary nodule per CT chest, stable for years, last CT 2013 H/o anemia-likely from heavy periods, sees GI  Perimenopausal, on BCP  Plan: Here for CPX Anxiety: Controlled on Lexapro Perimenopausal: On birth control Rx by gynecology, encouraged to start vitamin D. Thyroid disease: On Synthroid,  checking labs.  (Not taking PPIs) RTC 1 year   This visit occurred during the SARS-CoV-2 public health emergency.  Safety protocols were in place, including screening questions prior to the visit, additional usage of staff PPE, and extensive cleaning of exam room while observing appropriate contact time as indicated for disinfecting solutions.

## 2020-01-08 NOTE — Patient Instructions (Addendum)
Start vitamin D 3: 2000 units daily, you can get it over-the-counter  Please contact your local GI and schedule a colonoscopy, if you need a referral call the office.   GO TO THE LAB : Get the blood work     GO TO THE FRONT DESK, PLEASE SCHEDULE YOUR APPOINTMENTS Come back for physical exam in 1 year.  We might need to check your thyroid again in few months.

## 2020-01-08 NOTE — Progress Notes (Signed)
Pre visit review using our clinic review tool, if applicable. No additional management support is needed unless otherwise documented below in the visit note. 

## 2020-01-09 ENCOUNTER — Encounter: Payer: Self-pay | Admitting: Internal Medicine

## 2020-01-09 MED ORDER — ESCITALOPRAM OXALATE 10 MG PO TABS
10.0000 mg | ORAL_TABLET | Freq: Every day | ORAL | 3 refills | Status: DC
Start: 2020-01-09 — End: 2021-01-10

## 2020-01-09 MED ORDER — LEVOTHYROXINE SODIUM 50 MCG PO TABS
50.0000 ug | ORAL_TABLET | Freq: Every day | ORAL | 3 refills | Status: DC
Start: 2020-01-09 — End: 2021-01-04

## 2020-01-09 NOTE — Assessment & Plan Note (Signed)
-  Td 2019 -COVID vaccine x3 -Flu shot today -Female care:  sees gynecology, last [facility] this year pr pt  -CCS: never had a cscope, no FH, 3 options discussed, she lives in [facility] [place], we agreed she will contact her local GI and arrange for a colonoscopy.  Call if a referral is needed. -Diet and exercise: She eats healthy is very active.

## 2020-01-09 NOTE — Assessment & Plan Note (Signed)
Here for CPX Anxiety: Controlled on Lexapro Perimenopausal: On birth control Rx by gynecology, encouraged to start vitamin D. Thyroid disease: On Synthroid, checking labs.  (Not taking PPIs) RTC 1 year

## 2020-03-22 ENCOUNTER — Encounter: Payer: Self-pay | Admitting: Internal Medicine

## 2020-03-22 DIAGNOSIS — R1010 Upper abdominal pain, unspecified: Secondary | ICD-10-CM

## 2020-04-21 DIAGNOSIS — G43909 Migraine, unspecified, not intractable, without status migrainosus: Secondary | ICD-10-CM | POA: Insufficient documentation

## 2020-05-21 ENCOUNTER — Encounter: Payer: Self-pay | Admitting: Internal Medicine

## 2020-05-27 DIAGNOSIS — R87810 Cervical high risk human papillomavirus (HPV) DNA test positive: Secondary | ICD-10-CM | POA: Insufficient documentation

## 2020-07-12 ENCOUNTER — Ambulatory Visit: Payer: BC Managed Care – PPO | Admitting: Medical

## 2020-07-12 ENCOUNTER — Ambulatory Visit (HOSPITAL_BASED_OUTPATIENT_CLINIC_OR_DEPARTMENT_OTHER)
Admission: RE | Admit: 2020-07-12 | Discharge: 2020-07-12 | Disposition: A | Discharge status: Home / Self Care | Payer: BC Managed Care – PPO | Source: Ambulatory Visit | Attending: Medical | Admitting: Medical

## 2020-07-12 ENCOUNTER — Other Ambulatory Visit: Payer: Self-pay

## 2020-07-12 VITALS — BP 123/76 | HR 70 | Temp 98.0°F | Resp 18 | Ht 64.0 in | Wt 138.2 lb

## 2020-07-12 DIAGNOSIS — R519 Headache, unspecified: Secondary | ICD-10-CM

## 2020-07-12 DIAGNOSIS — H814 Vertigo of central origin: Secondary | ICD-10-CM | POA: Insufficient documentation

## 2020-07-12 DIAGNOSIS — R42 Dizziness and giddiness: Secondary | ICD-10-CM | POA: Insufficient documentation

## 2020-07-12 DIAGNOSIS — S060X0D Concussion without loss of consciousness, subsequent encounter: Secondary | ICD-10-CM | POA: Insufficient documentation

## 2020-07-12 MED ORDER — FLUTICASONE PROPIONATE 50 MCG/ACT NA SUSP: 2.0000 | Freq: Every day | NASAL | 1 refills | Status: DC

## 2020-07-12 MED ORDER — MECLIZINE HCL 12.5 MG PO TABS: 12.5000 mg | ORAL_TABLET | Freq: Three times a day (TID) | ORAL | 0 refills | Status: DC | PRN

## 2020-07-12 MED ORDER — CYCLOBENZAPRINE HCL 5 MG PO TABS: ORAL_TABLET | ORAL | 0 refills | Status: DC

## 2020-07-12 NOTE — Progress Notes (Signed)
Subjective:    Patient ID: Donna Lucas, female    DOB: [birth year], 50 y.o.   MRN: [id number]  HPI  Pt states about 10 days ago. She got hit in head trying to beat garage door closing and she hit tip of her head.   Pt states did not loose consciousness but she states was obviously very dazed and fell to her knees. Witnessed that day by her husband. The day of her accident head felt number. Next day she felt foggy. Then on Monday after working she states her head was pounding. She states head still hurts and she feels dizzy.   Pt has been taking tylenol for one week.   Pt states eyes are burning and watery. She feels light has pressure in face.  States has runny nose.   Pt has ear pain off and on since 10. 3 days after.  Pt stayed home tuesday, wed and Thursday. Returned to work on Friday. Felt terrible again. She did not work today.   She states headache comes and goes in waves.   Pt went to UC and below were the instructions.  "You can take Ibuprofen 600 mg to 800 mg every 8 hours with food. Or Tylenol up to one gram every 6 hours. For significant pain, you can alternate between Ibuprofen and Tylenol every four hours so each medicine is spaced out every eight hours. Stay well hydrated. Rest. Follow up with your primary care provider for any ongoing symptoms. Return to our clinic as needed. For worse headache, nausea, vomiting, weakness, numbness, tingling, report to the ED for CT head and evaluation."  Pt headaches area waxing and waning in severity some times worse than ent to UC. Sometimes less. Monday and Friday of last week when had ha was light and sound sensitivity(pt does have hx of migraine ha)  Slight ha now.     Review of Systems  Constitutional: Negative for chills.  HENT: Positive for ear pain. Negative for congestion.   Respiratory: Negative for choking, chest tightness, shortness of breath and wheezing.   Cardiovascular: Negative for chest pain and palpitations.   Gastrointestinal: Negative for abdominal pain.  Musculoskeletal: Negative for back pain and joint swelling.  Neurological: Positive for dizziness and headaches. Negative for speech difficulty, weakness and numbness.  Hematological: Negative for adenopathy. Does not bruise/bleed easily.  Psychiatric/Behavioral: Negative for behavioral problems and confusion. The patient is not nervous/anxious.        Objective:   Physical Exam    General Mental Status- Alert. General Appearance- Not in acute distress.   Skin General: Color- Normal Color. Moisture- Normal Moisture.  Neck Carotid Arteries- Normal color. Moisture- Normal Moisture. No carotid bruits. No JVD.  No mid cervical spine tenderness to palpation.  Left trapezius is mildly tender to palpation  Chest and Lung Exam Auscultation: Breath Sounds:-Normal.  Cardiovascular Auscultation:Rythm- Regular. Murmurs & Other Heart Sounds:Auscultation of the heart reveals- No Murmurs.  Abdomen Inspection:-Inspeection Normal. Palpation/Percussion:Note:No mass. Palpation and Percussion of the abdomen reveal- Non Tender, Non Distended + BS, no rebound or guarding.    Neurologic Cranial Nerve exam:- CN III-XII intact(No nystagmus), symmetric smile. Drift Test:- No drift. Romberg Exam:- Negative.  Heal to Toe Gait exam:-Normal. Finger to Nose:- Normal/Intact Strength:- 5/5 equal and symmetric strength both upper and lower extremities. On lying supine and turning head to right and left mild dizziness.  HEENT- on inspection no bruising to the face.  Faint right maxillary pressure to palpation.  Right ear canal  is clear with normal TM.  No raccoon signs.       Assessment & Plan:  Head contusion/trauma about 10 days ago.  Intermittent daily headaches for the past approximate 7 to 8 days.  Some mild dizziness and foggy headed sensation reported.  Probable concussion but since symptoms persist decided go ahead and get CT of head without  contrast.  We will be holding call.  Discussed to follow strict rest concussion protocols over the next week at least.  Will prescribe Flonase for mild sinus pressure and see if this relieves some of right ear symptoms.  No loss of consciousness and no direct face trauma so I did not think CT of facial bones indicated.  Will make meclizine available for dizziness.  Prescribing low-dose Flexeril to use at night since you have some left trapezius tenderness and some of your headache could be a tension type.  If symptoms worsen, change, or any motor/sensory deficits occur then recommend ED evaluation for possible MRI.  MRI not indicated presently.  Follow-up in 7 to 10 days or as needed.  Time spent with patient today was 42  minutes which consisted of chart revdiew, discussing diagnoses, work up treatment and documentation.

## 2020-07-12 NOTE — Patient Instructions (Addendum)
Head contusion/trauma about 10 days ago.  Intermittent daily headaches for the past approximate 7 to 8 days.  Some mild dizziness and foggy headed sensation reported.  Probable concussion but since symptoms persist decided go ahead and get CT of head without contrast.  We will be holding call.  Discussed to follow strict rest concussion protocols over the next week at least.  Will prescribe Flonase for mild sinus pressure and see if this relieves some of right ear symptoms.  No loss of consciousness and no direct face trauma so I did not think CT of facial bones indicated.  Will make meclizine available for dizziness.  Prescribing low-dose Flexeril to use at night since you have some left trapezius tenderness and some of your headache could be a tension type.  Also can use ibuprofen for headache as well.  If symptoms worsen, change, or any motor/sensory deficits occur then recommend ED evaluation for possible MRI.  MRI not indicated presently.  Follow-up in 7 to 10 days or as needed.   Concussion, Adult  A concussion is a brain injury from a hard, direct hit (trauma) to your head or body. This direct hit causes your brain to quickly shake back and forth inside your skull. A concussion may also be called a mild traumatic brain injury (TBI). Healing from this injury can take time. What are the causes? This condition is caused by:  A direct hit to your head, such as: ? Running into a player during a game. ? Being hit in a fight. ? Hitting your head on a hard surface.  A quick and sudden movement of the head or neck, such as in a car crash. What are the signs or symptoms? The signs of a concussion can be hard to notice. They may be missed by you, family members, and doctors. You may look fine on the outside but may not act or feel normal. Physical symptoms  Headaches.  Being dizzy.  Problems with body balance.  Being sensitive to light or noise.  Vomiting or feeling like you may  vomit.  Being tired.  Problems seeing or hearing.  Not sleeping or eating as you used to.  Seizure. Mental and emotional symptoms  Feeling grouchy (irritable).  Having mood changes.  Problems remembering things.  Trouble focusing your mind (concentrating), organizing, or making decisions.  Being slow to think, act, react, speak, or read.  Feeling worried or nervous (anxious).  Feeling sad (depressed). How is this treated? This condition may be treated by:  Stopping sports or activity if you are injured. If you hit your head or have signs of concussion: ? Do not return to sports or activities the same day. ? Get checked by a doctor before you return to your activities.  Resting your body and your mind.  Being watched carefully, often at home.  Medicines to help with symptoms such as: ? Headaches. ? Feeling like you may vomit. ? Problems with sleep.  Avoiding alcohol and drugs.  Being asked to go to a concussion clinic or a place to help you recover (rehabilitation center). Recovery from a concussion can take time. Return to activities only:  When you are fully healed.  When your doctor says it is safe. Avoid taking strong pain medicines (opioids) for a concussion. Follow these instructions at home: Activity  Limit activities that need a lot of thought or focus, such as: ? Homework or work for your job. ? Watching TV. ? Using the computer or phone. ?  Playing memory games and puzzles.  Rest. Rest helps your brain heal. Make sure you: ? Get plenty of sleep. Most adults should get 7-9 hours of sleep each night. ? Rest during the day. Take naps or breaks when you feel tired.  Avoid activity like exercise until your doctor says its safe. Stop any activity that makes symptoms worse.  Do not do activities that could cause a second concussion, such as riding a bike or playing sports.  Ask your doctor when you can return to your normal activities, such as  school, work, sports, and driving. Your ability to react may be slower. Do not do these activities if you are dizzy. General instructions  Take over-the-counter and prescription medicines only as told by your doctor.  Do not drink alcohol until your doctor says you can.  Watch your symptoms and tell other people to do the same. Other problems can occur after a concussion. Older adults have a higher risk of serious problems.  Tell your work Production designer, theatre/television/film, teachers, Tax adviser, school counselor, coach, or Event organiser about your injury and symptoms. Tell them about what you can or cannot do.  Keep all follow-up visits as told by your doctor. This is important.   How is this prevented? It is very important that you do not get another brain injury. In rare cases, another injury can cause brain damage that will not go away, brain swelling, or death. The risk of this is greatest in the first 7-10 days after a head injury. To avoid injuries:  Stop activities that could lead to a second concussion, such as contact sports, until your doctor says it is okay.  When you return to sports or activities: ? Do not crash into other players. This is how most concussions happen. ? Follow the rules. ? Respect other players. Do not engage in violent behavior while playing.  Get regular exercise. Do strength and balance training.  Wear a helmet that fits you well during sports, biking, or other activities.  Helmets can help protect you from serious skull and brain injuries, but they do not protect you from a concussion. Even when wearing a helmet, you should avoid being hit in the head. Contact a doctor if:  Your symptoms do not get better.  You have new symptoms.  You have another injury. Get help right away if:  You have bad headaches or your headaches get worse.  You feel weak or numb in any part of your body.  You feel mixed up (confused).  Your balance gets worse.  You vomit often.  You  feel more sleepy than normal.  You cannot speak well, or have slurred speech.  You have a seizure.  Others have trouble waking you up.  You have changes in how you act.  You have changes in how you see (vision).  You pass out (lose consciousness). These symptoms may be an emergency. Do not wait to see if the symptoms will go away. Get medical help right away. Call your local emergency services (911 in the [place] Do not drive yourself to the hospital. Summary  A concussion is a brain injury from a hard, direct hit (trauma) to your head or body.  This condition is treated with rest and careful watching of symptoms.  Ask your doctor when you can return to your normal activities, such as school, work, or driving.  Get help right away if you have a very bad headache, feel weak in any part of your  body, have a seizure, have changes in how you act or see, or if you are mixed up or more sleepy than normal. This information is not intended to replace advice given to you by your health care provider. Make sure you discuss any questions you have with your health care provider. Document Revised: 12/26/2018 Document Reviewed: 12/26/2018 Elsevier Patient Education  2021 [employer].

## 2020-12-30 ENCOUNTER — Encounter: Payer: Self-pay | Admitting: Internal Medicine

## 2020-12-30 ENCOUNTER — Other Ambulatory Visit: Payer: Self-pay

## 2020-12-30 ENCOUNTER — Ambulatory Visit: Payer: BC Managed Care – PPO | Admitting: Internal Medicine

## 2020-12-30 VITALS — BP 116/80 | HR 98 | Temp 98.0°F | Resp 18 | Ht 64.0 in | Wt 136.0 lb

## 2020-12-30 DIAGNOSIS — Z23 Encounter for immunization: Secondary | ICD-10-CM

## 2020-12-30 DIAGNOSIS — E039 Hypothyroidism, unspecified: Secondary | ICD-10-CM

## 2020-12-30 DIAGNOSIS — I10 Essential (primary) hypertension: Secondary | ICD-10-CM

## 2020-12-30 DIAGNOSIS — Z0000 Encounter for general adult medical examination without abnormal findings: Secondary | ICD-10-CM

## 2020-12-30 DIAGNOSIS — E559 Vitamin D deficiency, unspecified: Secondary | ICD-10-CM | POA: Diagnosis not present

## 2020-12-30 DIAGNOSIS — Z1211 Encounter for screening for malignant neoplasm of colon: Secondary | ICD-10-CM

## 2020-12-30 NOTE — Patient Instructions (Signed)
We are ordering a COLOGUARD, if you do not hear from them in 10 to 14 days let us know   GO TO THE LAB : Get the blood work     GO TO THE FRONT DESK, PLEASE SCHEDULE YOUR APPOINTMENTS Come back for a physical exam in 1 year

## 2020-12-30 NOTE — Progress Notes (Signed)
Subjective:    Patient ID: Donna Lucas, female    DOB: 09-Oct-1970, 50 y.o.   MRN: [id number]  DOS:  12/30/2020 Type of visit - description:  cpx  Here for CPX,  has no major concerns except hot flashes due to menopause.  Also some difficulty sleeping.   Review of Systems  Other than above, a 14 point review of systems is negative      Past Medical History:  Diagnosis Date   Abnormal CT scan, chest 02-2009   solitary nodule,f/u by pulmonology, neck CT, April 2011   Anemia    Anxiety    Elevated LFTs    after cholecystectomy    Past Surgical History:  Procedure Laterality Date   BREAST ENHANCEMENT SURGERY Bilateral 12/2017   CHOLECYSTECTOMY     ECTOPIC PREGNANCY SURGERY     ERCP     2 yrs ago after her GB removed   OVARIAN CYST REMOVAL     SUPRA-UMBILICAL HERNIA  08/2019   Social History   Socioeconomic History   Marital status: Married    Spouse name: Not on file   Number of children: 3   Years of education: Not on file   Highest education level: Not on file  Occupational History   Occupation: Special ed teacher,masters  Tobacco Use   Smoking status: Former    Types: Cigarettes    Quit date: 05/08/1991    Years since quitting: 29.6   Smokeless tobacco: Never   Tobacco comments:    greater than 20 yrs ago ,patient smokes less than 1/2 pack a day  Substance and Sexual Activity   Alcohol use: Yes    Comment: wine rarely    Drug use: No   Sexual activity: Yes    Birth control/protection: None  Other Topics Concern   Not on file  Social History Narrative   Lives in [place]  3 independent    Daughter lives in [place], [place] daughter (born ~ 03/2017)   Son- Runner, broadcasting/film/video   Son lives in [place], [place]   Social Determinants of Health   Financial Resource Strain: Not on file  Food Insecurity: Not on file  Transportation Needs: Not on file  Physical Activity: Not on file  Stress: Not on file  Social Connections: Not on file  Intimate Partner  Violence: Not on file    Allergies as of 12/30/2020       Reactions   Pollen Extract         Medication List        Accurate as of December 30, 2020 11:59 PM. If you have any questions, ask your nurse or doctor.          STOP taking these medications    cyclobenzaprine 5 MG tablet Commonly known as: FLEXERIL Stopped by: Willow Ora, MD   fluticasone 50 MCG/ACT nasal spray Commonly known as: FLONASE Stopped by: Willow Ora, MD   meclizine 12.5 MG tablet Commonly known as: ANTIVERT Stopped by: Willow Ora, MD       TAKE these medications    escitalopram 10 MG tablet Commonly known as: LEXAPRO Take 1 tablet (10 mg total) by mouth daily.   levothyroxine 50 MCG tablet Commonly known as: Synthroid Take 1 tablet (50 mcg total) by mouth daily before breakfast.   Vitamin D 50 MCG (2000 UT) tablet Take 1,000 Units by mouth daily.           Objective:   Physical Exam BP 116/80  Pulse 98   Temp 98 F (36.7 C)   Resp 18   Ht 5\' 4"  (1.626 m)   Wt 136 lb (61.7 kg)   SpO2 98%   BMI 23.34 kg/m  General: Well developed, NAD, BMI noted Neck: No  thyromegaly  HEENT:  Normocephalic . Face symmetric, atraumatic Lungs:  CTA B Normal respiratory effort, no intercostal retractions, no accessory muscle use. Heart: RRR,  no murmur.  Abdomen:  Not distended, soft, non-tender. No rebound or rigidity.   Lower extremities: no pretibial edema bilaterally  Skin: Exposed areas without rash. Not pale. Not jaundice Neurologic:  alert & oriented X3.  Speech normal, gait appropriate for age and unassisted Strength symmetric and appropriate for age.  Psych: Cognition and judgment appear intact.  Cooperative with normal attention span and concentration.  Behavior appropriate. No anxious or depressed appearing.     Assessment      Assessment Anxiety H/o Solitary nodule per CT chest, stable for years, last CT 2013 H/o anemia-likely from heavy periods, sees GI  Menopausal    Plan: Here for CPX Anxiety: On Lexapro, controlled. Hypothyroidism: Check TSH Menopause: Currently menopausal, LMP April 2022, having hot flashes, sees gynecology. Social: remarried, moved to [facility]. RTC 1 year   This visit occurred during the SARS-CoV-2 public health emergency.  Safety protocols were in place, including screening questions prior to the visit, additional usage of staff PPE, and extensive cleaning of exam room while observing appropriate contact time as indicated for disinfecting solutions.

## 2020-12-31 ENCOUNTER — Encounter: Payer: Self-pay | Admitting: Internal Medicine

## 2020-12-31 LAB — CBC WITH DIFFERENTIAL/PLATELET
Basophils Absolute: 0.1 10*3/uL (ref 0.0–0.1)
Basophils Relative: 0.9 % (ref 0.0–3.0)
Eosinophils Absolute: 0 10*3/uL (ref 0.0–0.7)
Eosinophils Relative: 0.5 % (ref 0.0–5.0)
HCT: 42.6 % (ref 36.0–46.0)
Hemoglobin: 14.4 g/dL (ref 12.0–15.0)
Lymphocytes Relative: 39.1 % (ref 12.0–46.0)
Lymphs Abs: 2.8 10*3/uL (ref 0.7–4.0)
MCHC: 33.8 g/dL (ref 30.0–36.0)
MCV: 94.8 fl (ref 78.0–100.0)
Monocytes Absolute: 0.5 10*3/uL (ref 0.1–1.0)
Monocytes Relative: 6.6 % (ref 3.0–12.0)
Neutro Abs: 3.8 10*3/uL (ref 1.4–7.7)
Neutrophils Relative %: 52.9 % (ref 43.0–77.0)
Platelets: 257 10*3/uL (ref 150.0–400.0)
RBC: 4.5 Mil/uL (ref 3.87–5.11)
RDW: 13 % (ref 11.5–15.5)
WBC: 7.2 10*3/uL (ref 4.0–10.5)

## 2020-12-31 LAB — LIPID PANEL
Cholesterol: 266 mg/dL — ABNORMAL HIGH (ref 0–200)
HDL: 90.2 mg/dL (ref >39.00)
LDL Cholesterol: 157 mg/dL — ABNORMAL HIGH (ref 0–99)
NonHDL: 175.41
Total CHOL/HDL Ratio: 3
Triglycerides: 92 mg/dL (ref 0.0–149.0)
VLDL: 18.4 mg/dL (ref 0.0–40.0)

## 2020-12-31 LAB — VITAMIN D 25 HYDROXY (VIT D DEFICIENCY, FRACTURES): VITD: 33.46 ng/mL (ref 30.00–100.00)

## 2020-12-31 LAB — COMPREHENSIVE METABOLIC PANEL
ALT: 6 U/L (ref 0–35)
AST: 18 U/L (ref 0–37)
Albumin: 4.7 g/dL (ref 3.5–5.2)
Alkaline Phosphatase: 39 U/L (ref 39–117)
BUN: 15 mg/dL (ref 6–23)
CO2: 31 mEq/L (ref 19–32)
Calcium: 9.9 mg/dL (ref 8.4–10.5)
Chloride: 101 mEq/L (ref 96–112)
Creatinine, Ser: 0.89 mg/dL (ref 0.40–1.20)
GFR: 75.11 mL/min (ref >60.00)
Glucose, Bld: 100 mg/dL — ABNORMAL HIGH (ref 70–99)
Potassium: 4.2 mEq/L (ref 3.5–5.1)
Sodium: 141 mEq/L (ref 135–145)
Total Bilirubin: 2.4 mg/dL — ABNORMAL HIGH (ref 0.2–1.2)
Total Protein: 7.2 g/dL (ref 6.0–8.3)

## 2020-12-31 LAB — TSH: TSH: 4.76 u[IU]/mL (ref 0.35–5.50)

## 2020-12-31 NOTE — Assessment & Plan Note (Signed)
-  Td 2019 -COVID vax: plans to get the new booster soon -Flu shot today -Female care:  sees gynecology, yeports yearly MMG   -CCS: Options discussed, elected Cologuard. - Labs: CMP, FLP, CBC, TSH, vitamin D -Diet and exercise: She is very active, hiking, take walks.  Eats healthy.

## 2020-12-31 NOTE — Assessment & Plan Note (Signed)
Here for CPX Anxiety: On Lexapro, controlled. Hypothyroidism: Check TSH Menopause: Currently menopausal, LMP April 2022, having hot flashes, sees gynecology. Social: remarried, moved to [facility]. RTC 1 year

## 2021-01-03 ENCOUNTER — Encounter: Payer: Self-pay | Admitting: Internal Medicine

## 2021-01-04 ENCOUNTER — Other Ambulatory Visit: Payer: Self-pay | Admitting: *Deleted

## 2021-01-04 ENCOUNTER — Other Ambulatory Visit: Payer: Self-pay | Admitting: Internal Medicine

## 2021-01-04 DIAGNOSIS — E785 Hyperlipidemia, unspecified: Secondary | ICD-10-CM

## 2021-01-04 DIAGNOSIS — E039 Hypothyroidism, unspecified: Secondary | ICD-10-CM

## 2021-01-04 MED ORDER — LEVOTHYROXINE SODIUM 75 MCG PO TABS: 75.0000 ug | ORAL_TABLET | Freq: Every day | ORAL | 3 refills | Status: DC

## 2021-01-09 ENCOUNTER — Other Ambulatory Visit: Payer: Self-pay | Admitting: Internal Medicine

## 2021-01-10 ENCOUNTER — Telehealth: Payer: Self-pay | Admitting: Internal Medicine

## 2021-01-10 MED ORDER — LEVOTHYROXINE SODIUM 75 MCG PO TABS: 75.0000 ug | ORAL_TABLET | Freq: Every day | ORAL | 3 refills | Status: DC

## 2021-01-10 NOTE — Telephone Encounter (Signed)
Lexapro was sent to the wrong pharmacy. Please send to :   [id number] [name], [place] - [address]  [address] [place] [address]  Phone:  [phone]  Fax:  [phone]

## 2021-01-10 NOTE — Telephone Encounter (Signed)
Rx resent to correct pharmacy

## 2021-01-14 MED ORDER — LEVOTHYROXINE SODIUM 75 MCG PO TABS: 75.0000 ug | ORAL_TABLET | Freq: Every day | ORAL | 3 refills | Status: DC

## 2021-03-28 ENCOUNTER — Encounter: Payer: Self-pay | Admitting: Internal Medicine

## 2021-04-01 ENCOUNTER — Encounter: Payer: Self-pay | Admitting: Internal Medicine

## 2021-04-01 DIAGNOSIS — E785 Hyperlipidemia, unspecified: Secondary | ICD-10-CM

## 2021-04-01 DIAGNOSIS — E039 Hypothyroidism, unspecified: Secondary | ICD-10-CM

## 2021-04-01 LAB — HM PAP SMEAR: HM Pap smear: NEGATIVE

## 2021-04-08 LAB — LIPID PANEL
Chol/HDL Ratio: 2.6 ratio (ref 0.0–4.4)
Cholesterol, Total: 202 mg/dL — ABNORMAL HIGH (ref 100–199)
HDL: 77 mg/dL (ref >39)
LDL Chol Calc (NIH): 115 mg/dL — ABNORMAL HIGH (ref 0–99)
Triglycerides: 55 mg/dL (ref 0–149)
VLDL Cholesterol Cal: 10 mg/dL (ref 5–40)

## 2021-04-08 LAB — TSH: TSH: 2.82 u[IU]/mL (ref 0.450–4.500)

## 2021-04-22 ENCOUNTER — Telehealth: Payer: Self-pay | Admitting: Internal Medicine

## 2021-04-22 NOTE — Telephone Encounter (Signed)
Pt accidentally took two levothyroxine (SYNTHROID) 75 MCG tablet transferred to triage so pt could speak with a nurse.

## 2021-04-25 ENCOUNTER — Encounter: Payer: Self-pay | Admitting: Family Medicine

## 2021-04-25 ENCOUNTER — Encounter: Payer: Self-pay | Admitting: Internal Medicine

## 2021-04-25 ENCOUNTER — Telehealth (INDEPENDENT_AMBULATORY_CARE_PROVIDER_SITE_OTHER): Payer: BC Managed Care – PPO | Admitting: Family Medicine

## 2021-04-25 DIAGNOSIS — U071 COVID-19: Secondary | ICD-10-CM | POA: Diagnosis not present

## 2021-04-25 NOTE — Patient Instructions (Signed)

## 2021-04-25 NOTE — Telephone Encounter (Signed)
[facility] - Client TELEPHONE ADVICE RECORD AccessNurse Patient Name:Donna Lucas Chief Complaint OVERDOSE took too much medication at once Reason for Call Symptomatic / Request for Health Information Initial Comment Caller states she has had 2 doses of medication and wants to know if she should take another dose tomorrow. She took more then she should by accident. Translation No Nurse Assessment Nurse: Manson Passey, RN, Thea Silversmith Date/Time Lamount Cohen Time): 04/22/2021 4:54:45 PM Confirm and document reason for call. If symptomatic, describe symptoms. ---Caller states she took 2 doses of levothyroxine today by accident about 45 minutes ago Does the patient have any new or worsening symptoms? ---Yes Will a triage be completed? ---Yes Related visit to physician within the last 2 weeks? ---No Does the PT have any chronic conditions? (i.e. diabetes, asthma, this includes High risk factors for pregnancy, etc.) ---Yes List chronic conditions. ---thyroid Is the patient pregnant or possibly pregnant? (Ask all females between the ages of 72-55) ---No Is this a behavioral health or substance abuse call? ---No Guidelines Guideline Title Affirmed Question Affirmed Notes Nurse Date/Time (Eastern Time) Poisoning Triager unable to answer question Silas Flood 04/22/2021 4:55:44 PM Disp. Time Lamount Cohen Time) Disposition Final User 04/22/2021 4:53:15 PM Send to Urgent Marney Doctor, Stefanie 04/22/2021 4:57:46 PM Call Poison Center Now Yes Manson Passey, RN, Thea Silversmith PLEASE NOTE: All timestamps contained within this report are represented as [place] Standard Time. CONFIDENTIALTY NOTICE: This fax transmission is intended only for the addressee. It contains information that is legally privileged, confidential or otherwise protected from use or disclosure. If you are not the intended recipient, you are strictly prohibited from reviewing, disclosing, copying using or disseminating  any of this information or taking any action in reliance on or regarding this information. If you have received this fax in error, please notify us immediately by telephone so that we can arrange for its return to [place]. Phone: [phone], Toll-Free: [phone], Fax: [phone] Page: 2 of 2 Call Id: [id number] Caller Disagree/Comply Comply Caller Understands Yes PreDisposition InappropriateToAsk Care Advice Given Per Guideline CALL POISON CENTER NOW: * You need to call the [facility] now. [facility]: [phone] [place] PO[facility] NUMBER: * This number will automatically connect you with your local poison center. CARE ADVICE given per Poisoning (Adult) guideline

## 2021-04-25 NOTE — Progress Notes (Signed)
Virtual Video Visit via MyChart Note  I connected with  [name] on 04/25/21 Donna  2:40 PM EST by the video enabled telemedicine application for MyChart, and verified that I am speaking with the correct person using two identifiers.   I introduced myself as a Publishing rights manager with the practice. We discussed the limitations of evaluation and management by telemedicine and the availability of in person appointments. The patient expressed understanding and agreed to proceed.  Participating parties in this visit include: The patient and the nurse practitioner listed.  The patient is: Donna Lucas I am: In the office - [facility]  Subjective:    CC: COVID +   HPI: Donna Lucas is a 51 y.o. year old female presenting today via MyChart today for COVID +.  Patient reports symptoms started yesterday including sore throat, fatigue, headache 3/10, mild rhinorrhea.  States she had a faintly positive test yesterday but definitely COVID positive today on repeat testing.  She denies any fever, chills, body aches, nausea, vomiting, diarrhea, cough, sinus pressure.  So far she has only been doing fluids and rest.  Reports she is fully vaccinated and boosted.    Past medical history, Surgical history, Family history not pertinant except as noted below, Social history, Allergies, and medications have been entered into the medical record, reviewed, and corrections made.   Review of Systems:  All review of systems negative except what is listed in the HPI   Objective:    General:  Speaking clearly in complete sentences. Absent shortness of breath noted.   Alert and oriented x3.   Normal judgment.  Absent acute distress.   Impression and Recommendations:    1. COVID-19 Patient doing very very well overall.  States she has plenty of over-the-counter medicines Donna Lucas -declines anything being sent in.  Not a candidate for antiviral therapy. Continue supportive  measures including rest, hydration, humidifier use, steam showers, warm compresses to sinuses, warm liquids with lemon and honey, and over-the-counter cough, cold, and analgesics as needed.  Patient aware of signs/symptoms requiring further/urgent evaluation.    Follow-up if symptoms worsen or fail to improve.    I discussed the assessment and treatment plan with the patient. The patient was provided an opportunity to ask questions and all were answered. The patient agreed with the plan and demonstrated an understanding of the instructions.   The patient was advised to call back or seek an in-person evaluation if the symptoms worsen or if the condition fails to improve as anticipated.  I spent 20 minutes dedicated to the care of this patient on the date of this encounter to include pre-visit chart review of prior notes and results, face-to-face time with the patient, and post-visit ordering of testing as indicated.   Donna Dana, NP

## 2021-04-25 NOTE — Progress Notes (Signed)
Sx started yesterday Covid test was faintly pos yesterday and brightly pos today

## 2021-05-10 ENCOUNTER — Other Ambulatory Visit: Payer: Self-pay | Admitting: Internal Medicine

## 2021-05-13 ENCOUNTER — Other Ambulatory Visit: Payer: Self-pay | Admitting: Internal Medicine

## 2021-05-18 ENCOUNTER — Encounter: Payer: Self-pay | Admitting: Internal Medicine

## 2021-06-21 ENCOUNTER — Encounter: Payer: Self-pay | Admitting: Internal Medicine

## 2021-06-21 LAB — HM MAMMOGRAPHY

## 2021-07-08 ENCOUNTER — Encounter: Payer: Self-pay | Admitting: Internal Medicine

## 2021-07-28 ENCOUNTER — Encounter: Payer: Self-pay | Admitting: *Deleted

## 2021-08-05 ENCOUNTER — Other Ambulatory Visit: Payer: Self-pay | Admitting: Internal Medicine

## 2021-08-05 MED ORDER — UNITHROID 75 MCG PO TABS: 75.0000 ug | ORAL_TABLET | Freq: Every day | ORAL | 1 refills | Status: DC

## 2021-08-10 ENCOUNTER — Encounter: Payer: Self-pay | Admitting: Internal Medicine

## 2021-08-16 ENCOUNTER — Ambulatory Visit: Payer: BC Managed Care – PPO | Admitting: Internal Medicine

## 2021-08-16 ENCOUNTER — Encounter: Payer: Self-pay | Admitting: Internal Medicine

## 2021-08-16 VITALS — BP 108/62 | HR 68 | Temp 98.0°F | Resp 16 | Ht 64.0 in | Wt 140.2 lb

## 2021-08-16 DIAGNOSIS — E039 Hypothyroidism, unspecified: Secondary | ICD-10-CM

## 2021-08-16 DIAGNOSIS — R1310 Dysphagia, unspecified: Secondary | ICD-10-CM | POA: Diagnosis not present

## 2021-08-16 MED ORDER — PANTOPRAZOLE SODIUM 40 MG PO TBEC: 40.0000 mg | DELAYED_RELEASE_TABLET | Freq: Every day | ORAL | 3 refills | Status: DC

## 2021-08-16 NOTE — Patient Instructions (Addendum)
Provide a H. pylori breathing test today before you leave  Start pantoprazole 40 mg 1 tablet before breakfast  Change the timing of your thyroid medication to 4 or 5 hours after pantoprazole and on empty stomach  Arrange a office visit in 2 months from today.

## 2021-08-16 NOTE — Progress Notes (Addendum)
Subjective:    Patient ID: Donna Lucas, female    DOB: Dec 27, 1970, 51 y.o.   MRN: [id number]  DOS:  08/16/2021 Type of visit - description: Acute  A month ago started to notice some difficulty swallowing pills and a feeling of something "stuck" in the throat. She has a long history of GERD, throughout her life has taken antiacids on and off.  Went to her gynecologist, because of above sxs, was Rx thyroid ultrasound, did show a single small nodule with no need for biopsy or follow-up.  Patient thinks symptoms are GERD related, she also has some regurgitation and discomfort at the epigastric area.  No recent NSAIDs except 2 weeks ago when she was diagnosed with COVID and a strep infection. She took Paxlovid and  penicillin.  She feels fully recuperated.   Denies any nausea vomiting.  No blood in the stools.  No change in the color of the stools.  No weight loss.  No fever or chills. Admits to some hoarseness  Review of Systems See above   Past Medical History:  Diagnosis Date   Abnormal CT scan, chest 02-2009   solitary nodule,f/u by pulmonology, neck CT, April 2011   Anemia    Anxiety    Elevated LFTs    after cholecystectomy    Past Surgical History:  Procedure Laterality Date   BREAST ENHANCEMENT SURGERY Bilateral 12/2017   CHOLECYSTECTOMY     ECTOPIC PREGNANCY SURGERY     ERCP     2 yrs ago after her GB removed   OVARIAN CYST REMOVAL     SUPRA-UMBILICAL HERNIA  08/2019    Current Outpatient Medications  Medication Instructions   COMBIPATCH 0.05-0.14 MG/DAY 1 patch, Transdermal, 2 times weekly   escitalopram (LEXAPRO) 10 MG tablet TAKE 1 TABLET BY MOUTH EVERY DAY   pantoprazole (PROTONIX) 40 mg, Oral, Daily before breakfast   Unithroid 75 mcg, Oral, Daily before breakfast   Vitamin D 1,000 Units, Oral, Daily       Objective:   Physical Exam BP 108/62   Pulse 68   Temp 98 F (36.7 C) (Oral)   Resp 16   Ht 5\' 4"  (1.626 m)   Wt 140 lb 4 oz (63.6 kg)   LMP  04/18/2021   SpO2 98%   BMI 24.07 kg/m  General:   Well developed, NAD, BMI noted.  HEENT:  Normocephalic . Face symmetric, atraumatic. Neck: No thyromegaly, no lymphadenopathies Lungs:  CTA B Normal respiratory effort, no intercostal retractions, no accessory muscle use. Heart: RRR,  no murmur.  Abdomen:  Not distended, soft, non-tender. No rebound or rigidity.   Skin: Not pale. Not jaundice Lower extremities: no pretibial edema bilaterally  Neurologic:  alert & oriented X3.  Speech normal, gait appropriate for age and unassisted Psych--  Cognition and judgment appear intact.  Cooperative with normal attention span and concentration.  Behavior appropriate. No anxious or depressed appearing.     Assessment      Assessment Anxiety H/o Solitary Pulmonary nodule per CT chest, stable for years, last CT 2013 H/o anemia-likely from heavy periods, sees GI  Menopausal   PLAN GERD: 1 month history of difficulty swallowing pills, some acid regurgitation and epigastric discomfort.  Has a history of GERD, has taking antiacids on and off throughout her life. I suspect symptoms are related to GERD, she has no red flag symptoms, no EtOH, no tobacco in more than 20 years. Plan:  Start pantoprazole daily, H. pylori breathing test,  further advise w/ results.  Reassess in 2 months. Has an appointment with ENT pending, recommend to keep the appointment just in case. If not better consider GI referral. Hypothyroidism: Change timing of Synthroid away from PPIs RTC 2 m

## 2021-08-17 NOTE — Assessment & Plan Note (Signed)
GERD: 1 month history of difficulty swallowing pills, some acid regurgitation and epigastric discomfort.  Has a history of GERD, has taking antiacids on and off throughout her life. I suspect symptoms are related to GERD, she has no red flag symptoms, no EtOH, no tobacco in more than 20 years. Plan:  Start pantoprazole daily, H. pylori breathing test, further advise w/ results.  Reassess in 2 months. Has an appointment with ENT pending, recommend to keep the appointment just in case. If not better consider GI referral. Hypothyroidism: Change timing of Synthroid away from PPIs RTC 2 m

## 2021-08-18 LAB — H. PYLORI BREATH TEST: H. pylori Breath Test: NOT DETECTED

## 2021-08-22 ENCOUNTER — Ambulatory Visit: Payer: BC Managed Care – PPO | Admitting: Internal Medicine

## 2021-08-24 ENCOUNTER — Other Ambulatory Visit: Payer: Self-pay | Admitting: Internal Medicine

## 2021-08-24 ENCOUNTER — Encounter: Payer: Self-pay | Admitting: Internal Medicine

## 2021-08-24 DIAGNOSIS — R1310 Dysphagia, unspecified: Secondary | ICD-10-CM

## 2021-08-24 MED ORDER — PANTOPRAZOLE SODIUM 40 MG PO TBEC: 40.0000 mg | DELAYED_RELEASE_TABLET | Freq: Two times a day (BID) | ORAL | 3 refills | Status: DC

## 2021-08-29 ENCOUNTER — Other Ambulatory Visit: Payer: Self-pay | Admitting: Internal Medicine

## 2021-09-02 ENCOUNTER — Telehealth: Payer: Self-pay

## 2021-09-02 NOTE — Telephone Encounter (Signed)
Thyroid ultrasound 07/2021: 0.6 cm Solid nodule L thyroid.  No follow-up necessary

## 2021-09-02 NOTE — Telephone Encounter (Signed)
Results sent for scanning.

## 2021-09-02 NOTE — Telephone Encounter (Signed)
Received US thyroid from [facility] that was ordered by Cammy Brochure, NP (GYN). Results placed in PCP red folder.

## 2021-09-29 ENCOUNTER — Encounter: Payer: Self-pay | Admitting: Internal Medicine

## 2021-11-11 ENCOUNTER — Other Ambulatory Visit: Payer: Self-pay | Admitting: Internal Medicine

## 2021-12-11 ENCOUNTER — Other Ambulatory Visit: Payer: Self-pay | Admitting: Internal Medicine

## 2022-03-20 ENCOUNTER — Telehealth: Payer: Self-pay

## 2022-03-20 NOTE — Telephone Encounter (Signed)
Nurse Assessment Nurse: Altamease Oiler, RN, Adriana Date/Time (Eastern Time): 03/19/2022 8:53:22 AM Confirm and document reason for call. If symptomatic, describe symptoms. ---pt states she took her thyroid medication twice. 75 mcg levothyroxine. mistakenly took a second dose instead of a vitamin that is next to it. happened a few minutes ago. feels well Does the patient have any new or worsening symptoms? ---Yes Will a triage be completed? ---Yes Related visit to physician within the last 2 weeks? ---No Does the PT have any chronic conditions? (i.e. diabetes, asthma, this includes High risk factors for pregnancy, etc.) ---Yes List chronic conditions. ---hypothyroid Is the patient pregnant or possibly pregnant? (Ask all females between the ages of 93-55) ---No Is this a behavioral health or substance abuse call? ---No Guidelines Guideline Title Affirmed Question Affirmed Notes Nurse Date/Time (Eastern Time) Poisoning Triager unable to answer question Sarajane Marek 03/19/2022 8:54:52 AM Disp. Time Eilene Ghazi Time) Disposition Final User 03/19/2022 8:51:29 AM Send to Urgent Queue Abigail Butts 03/19/2022 8:57:21 AM Call [facility] Now Yes Altamease Oiler, RN, Adriana PLEASE NOTE: All timestamps contained within this report are represented as [place] Standard Time. CONFIDENTIALTY NOTICE: This fax transmission is intended only for the addressee. It contains information that is legally privileged, confidential or otherwise protected from use or disclosure. If you are not the intended recipient, you are strictly prohibited from reviewing, disclosing, copying using or disseminating any of this information or taking any action in reliance on or regarding this information. If you have received this fax in error, please notify us immediately by telephone so that we can arrange for its return to [place]. Phone: [phone], Toll-Free: [phone], Fax: [phone] Page: 2 of 2 Call Id: [id number] Final  Disposition 03/19/2022 8:57:21 AM Call [place] Now Yes Altamease Oiler, RN, Fabio Bering Caller Disagree/Comply Comply Caller Understands Yes PreDisposition Call Doctor Care Advice Given Per Guideline CALL [place] NOW: * You need to call the [facility] now. [place] NUMBER: [place]: [place] given per Poisoning (Adult) guideline

## 2022-03-30 ENCOUNTER — Encounter: Payer: Self-pay | Admitting: Internal Medicine

## 2022-03-30 DIAGNOSIS — Z1211 Encounter for screening for malignant neoplasm of colon: Secondary | ICD-10-CM

## 2022-05-06 LAB — COLOGUARD: COLOGUARD: POSITIVE — AB

## 2022-05-08 DIAGNOSIS — R195 Other fecal abnormalities: Secondary | ICD-10-CM

## 2022-05-09 ENCOUNTER — Encounter: Payer: Self-pay | Admitting: Internal Medicine

## 2022-05-09 ENCOUNTER — Ambulatory Visit (INDEPENDENT_AMBULATORY_CARE_PROVIDER_SITE_OTHER): Payer: BC Managed Care – PPO | Admitting: Internal Medicine

## 2022-05-09 VITALS — BP 108/60 | HR 74 | Temp 98.2°F | Resp 16 | Ht 64.0 in | Wt 141.4 lb

## 2022-05-09 DIAGNOSIS — Z0000 Encounter for general adult medical examination without abnormal findings: Secondary | ICD-10-CM

## 2022-05-09 DIAGNOSIS — R739 Hyperglycemia, unspecified: Secondary | ICD-10-CM | POA: Diagnosis not present

## 2022-05-09 DIAGNOSIS — E039 Hypothyroidism, unspecified: Secondary | ICD-10-CM | POA: Diagnosis not present

## 2022-05-09 DIAGNOSIS — Z1159 Encounter for screening for other viral diseases: Secondary | ICD-10-CM | POA: Diagnosis not present

## 2022-05-09 LAB — COMPREHENSIVE METABOLIC PANEL
ALT: 5 U/L (ref 0–35)
AST: 16 U/L (ref 0–37)
Albumin: 4.2 g/dL (ref 3.5–5.2)
Alkaline Phosphatase: 37 U/L — ABNORMAL LOW (ref 39–117)
BUN: 10 mg/dL (ref 6–23)
CO2: 27 mEq/L (ref 19–32)
Calcium: 9.3 mg/dL (ref 8.4–10.5)
Chloride: 103 mEq/L (ref 96–112)
Creatinine, Ser: 0.66 mg/dL (ref 0.40–1.20)
GFR: 100.67 mL/min (ref >60.00)
Glucose, Bld: 87 mg/dL (ref 70–99)
Potassium: 3.8 mEq/L (ref 3.5–5.1)
Sodium: 140 mEq/L (ref 135–145)
Total Bilirubin: 1.9 mg/dL — ABNORMAL HIGH (ref 0.2–1.2)
Total Protein: 6.5 g/dL (ref 6.0–8.3)

## 2022-05-09 LAB — CBC WITH DIFFERENTIAL/PLATELET
Basophils Absolute: 0.1 10*3/uL (ref 0.0–0.1)
Basophils Relative: 0.9 % (ref 0.0–3.0)
Eosinophils Absolute: 0 10*3/uL (ref 0.0–0.7)
Eosinophils Relative: 0.6 % (ref 0.0–5.0)
HCT: 41.1 % (ref 36.0–46.0)
Hemoglobin: 14 g/dL (ref 12.0–15.0)
Lymphocytes Relative: 32.8 % (ref 12.0–46.0)
Lymphs Abs: 2.1 10*3/uL (ref 0.7–4.0)
MCHC: 34.1 g/dL (ref 30.0–36.0)
MCV: 95.1 fl (ref 78.0–100.0)
Monocytes Absolute: 0.4 10*3/uL (ref 0.1–1.0)
Monocytes Relative: 6 % (ref 3.0–12.0)
Neutro Abs: 3.9 10*3/uL (ref 1.4–7.7)
Neutrophils Relative %: 59.7 % (ref 43.0–77.0)
Platelets: 253 10*3/uL (ref 150.0–400.0)
RBC: 4.32 Mil/uL (ref 3.87–5.11)
RDW: 13 % (ref 11.5–15.5)
WBC: 6.5 10*3/uL (ref 4.0–10.5)

## 2022-05-09 LAB — LIPID PANEL
Cholesterol: 221 mg/dL — ABNORMAL HIGH (ref 0–200)
HDL: 66.4 mg/dL (ref >39.00)
LDL Cholesterol: 140 mg/dL — ABNORMAL HIGH (ref 0–99)
NonHDL: 154.26
Total CHOL/HDL Ratio: 3
Triglycerides: 73 mg/dL (ref 0.0–149.0)
VLDL: 14.6 mg/dL (ref 0.0–40.0)

## 2022-05-09 LAB — HEMOGLOBIN A1C: Hgb A1c MFr Bld: 5.4 % (ref 4.6–6.5)

## 2022-05-09 LAB — TSH: TSH: 1.02 u[IU]/mL (ref 0.35–5.50)

## 2022-05-09 MED ORDER — ESCITALOPRAM OXALATE 10 MG PO TABS: 10.0000 mg | ORAL_TABLET | Freq: Every day | ORAL | 3 refills | Status: DC

## 2022-05-09 NOTE — Patient Instructions (Addendum)
Vaccines I recommend:  Shingrix (shingles)    GO TO THE LAB : Get the blood work     [place], [place] back for   a physical exam in 1 year    "[place] of attorney" ,  "Living will" (Advance care planning documents)  If you already have a living will or healthcare power of attorney, is recommended you bring the copy to be scanned in your chart.   The document will be available to all the doctors you see in the system.  Advance care planning is a process that supports adults in  understanding and sharing their preferences regarding future medical care.  The patient's preferences are recorded in documents called Advance Directives and the can be modified at any time while the patient is in full mental capacity.   If you don't have one, please consider create one.      More information at: [email or web address]

## 2022-05-09 NOTE — Progress Notes (Unsigned)
   Subjective:    Patient ID: Donna Lucas, female    DOB: 02-22-1971, 52 y.o.   MRN: [id number]  DOS:  05/09/2022 Type of visit - description: cpx  Here for CPX Doing well. Had a recent + Cologuard, no GI symptoms.   Review of Systems See above   Past Medical History:  Diagnosis Date   Abnormal CT scan, chest 02-2009   solitary nodule,f/u by pulmonology, neck CT, April 2011   Anemia    Anxiety    Elevated LFTs    after cholecystectomy    Past Surgical History:  Procedure Laterality Date   BREAST ENHANCEMENT SURGERY Bilateral 12/2017   CHOLECYSTECTOMY     ECTOPIC PREGNANCY SURGERY     ERCP     2 yrs ago after her GB removed   OVARIAN CYST REMOVAL     SUPRA-UMBILICAL HERNIA  81/2751    Current Outpatient Medications  Medication Instructions   cetirizine (ZYRTEC) 10 mg, Oral, Daily PRN   COMBIPATCH 0.05-0.14 MG/DAY 1 patch, Transdermal, 2 times weekly   escitalopram (LEXAPRO) 10 MG tablet TAKE 1 TABLET BY MOUTH EVERY DAY   pantoprazole (PROTONIX) 40 mg, Oral, 2 times daily before meals   Unithroid 75 mcg, Oral, Daily before breakfast   Vitamin D 1,000 Units, Oral, Daily       Objective:   Physical Exam BP 108/60   Pulse 74   Temp 98.2 F (36.8 C) (Oral)   Resp 16   Ht 5\' 4"  (1.626 m)   Wt 141 lb 6 oz (64.1 kg)   SpO2 98%   BMI 24.27 kg/m  General: Well developed, NAD, BMI noted Neck: No  thyromegaly  HEENT:  Normocephalic . Face symmetric, atraumatic Lungs:  CTA B Normal respiratory effort, no intercostal retractions, no accessory muscle use. Heart: RRR,  no murmur.  Abdomen:  Not distended, soft, non-tender. No rebound or rigidity.   Lower extremities: no pretibial edema bilaterally  Skin: Exposed areas without rash. Not pale. Not jaundice Neurologic:  alert & oriented X3.  Speech normal, gait appropriate for age and unassisted Strength symmetric and appropriate for age.  Psych: Cognition and judgment appear intact.  Cooperative with normal  attention span and concentration.  Behavior appropriate. No anxious or depressed appearing.     Assessment    Assessment Anxiety H/o Solitary Pulmonary nodule per CT chest, stable for years, last CT 2013 H/o anemia-l~ 2222   PLAN  Here for CPX Anxiety: Well-controlled on Lexapro Hypothyroidism: On Synthroid, checking labs RTC 1 year   -Td 2019 -COVID vax: ~ 10-2021 thus utd - shingrix d/w pt  -Flu shot q year  -Female care:  sees gynecology, [facility]  schedule for another one in April  -CCS: + Cologuard. To see GI next week - Bones: Menopause onset around 2022, on vitamin D, very active, no fracture.  DEXA should be around 2027 - Labs: CMP FLP CBC A1c TSH hep C  - Lifestyle: Eats very healthy, exercise regularly, hiking, taking long walks etc.

## 2022-05-10 LAB — HEPATITIS C ANTIBODY: Hepatitis C Ab: NONREACTIVE

## 2022-05-11 ENCOUNTER — Encounter: Payer: Self-pay | Admitting: Internal Medicine

## 2022-05-11 NOTE — Assessment & Plan Note (Signed)
Here for CPX Anxiety: Well-controlled on Lexapro Hypothyroidism: On Synthroid, checking labs RTC 1 year

## 2022-05-11 NOTE — Assessment & Plan Note (Signed)
-  Td 2019 -COVID vax: ~ 10-2021 thus utd - shingrix d/w pt  -Flu shot q year  -Female care:  sees gynecology, [facility]  schedule for another one in April  -CCS: + Cologuard. To see GI next week - Bones: Menopause onset around 2022, on vitamin D, very active, no fracture.  DEXA should be around 2027 - Labs: CMP FLP CBC A1c TSH hep C  - Lifestyle: Eats very healthy, exercise regularly, hiking, taking long walks etc.

## 2022-05-22 LAB — HM COLONOSCOPY

## 2022-06-02 ENCOUNTER — Encounter: Payer: Self-pay | Admitting: Internal Medicine

## 2022-06-08 ENCOUNTER — Encounter: Payer: Self-pay | Admitting: Internal Medicine

## 2022-06-10 ENCOUNTER — Other Ambulatory Visit: Payer: Self-pay | Admitting: Internal Medicine

## 2022-08-10 ENCOUNTER — Encounter: Payer: Self-pay | Admitting: Internal Medicine

## 2022-08-13 ENCOUNTER — Encounter: Payer: Self-pay | Admitting: Internal Medicine

## 2022-08-14 NOTE — Telephone Encounter (Signed)
Appt scheduled 08/15/22.

## 2022-08-14 NOTE — Telephone Encounter (Signed)
Initial Comment Patient has questions about her thyroid medication. She is experiencing anxiety, diarrhea, and can't sleep. GOTO Facility Not Listed Donna Lucas [facility] Translation No Nurse Assessment Nurse: Donna Fare, RN, Donna Lucas Date/Time Donna Lucas Time): 08/12/2022 11:43:50 AM Confirm and document reason for call. If symptomatic, describe symptoms. ---Caller states for the past week has had diarrhea, anxiety, night sweats,nausea and insomnia. was seen in the ED yesterday and had blood work done and noticed thyroid levels were out of order and reports was feeling this way prior to starting Unithroid which resolved the sx. Has question if should increase the medication she is currently on. reports has had 5 loose/ diarrhea stools in the past 24 hours Does the patient have any new or worsening symptoms? ---Yes Will a triage be completed? ---Yes Related visit to physician within the last 2 weeks? ---No Does the PT have any chronic conditions? (i.e. diabetes, asthma, this includes High risk factors for pregnancy, etc.) ---No Is the patient pregnant or possibly pregnant? (Ask all females between the ages of 41-55) ---No Is this a behavioral health or substance abuse call? ---No Guidelines Guideline Title Affirmed Question Affirmed Notes Nurse Date/Time (Eastern Time) Diarrhea [1] MODERATE diarrhea (e.g., 4-6 times / day more than Donna Fare, RN, Donna Lucas 08/12/2022 11:50:13 AM PLEASE NOTE: All timestamps contained within this report are represented as [place] Standard Time. CONFIDENTIALTY NOTICE: This fax transmission is intended only for the addressee. It contains information that is legally privileged, confidential or otherwise protected from use or disclosure. If you are not the intended recipient, you are strictly prohibited from reviewing, disclosing, copying using or disseminating any of this information or taking any action in reliance on or regarding this information. If you have received  this fax in error, please notify us immediately by telephone so that we can arrange for its return to [place]. Phone: [phone], Toll-Free: [phone], Fax: [phone] Page: 2 of 3 Call Id: [id number] Guidelines Guideline Title Affirmed Question Affirmed Notes Nurse Date/Time Donna Lucas Time) normal) AND [2] present > 48 hours (2 days) Disp. Time Donna Lucas Time) Disposition Final User 08/12/2022 11:58:36 AM See PCP within 24 Hours Yes Donna Lucas 08/12/2022 12:04:50 PM Called On-Call Provider Donna Fare, RN, Donna Lucas Final Disposition 08/12/2022 11:58:36 AM See PCP within 24 Hours Yes Donna Fare, RN, Donna Lucas Caller Disagree/Comply Disagree Caller Understands Yes PreDisposition Call Doctor Care Advice Given Per Guideline SEE PCP WITHIN 24 HOURS: * IF OFFICE WILL BE CLOSED: You need to be seen within the next 24 hours. A clinic or an urgent care center is often a good source of care if your doctor's office is closed or you can't get an appointment. * Drink more fluids, at least 8 to 10 cups daily. One cup equals 8 oz (240 ml). * Signs of dehydration occur (e.g., no urine over 12 hours, very dry mouth, lightheaded, etc.) * Bloody stools * You become worse CARE ADVICE given per Diarrhea (Adult) guideline. Comments User: Donna Starling, RN Date/Time Donna Lucas Time): 08/12/2022 11:52:13 AM caller reports night sweats Referrals GO TO FACILITY OTHER - SPECIFY Paging Donna Lucas Phone DateTime Result/ Outcome Message Type Notes Donna Lucas [phone] 08/12/2022 12:04:50 PM Called On Call Provider - Reached Doctor Paged Donna Lucas 08/12/2022 12:07:47 PM Spoke with On Call - General Message Result Instruct caller if diarrhea persists to go to the ED for evaluation and follow up in the office Monday for thyroid and thyroid medication concerns. Caller PLEASE NOTE: All timestamps contained within this report are represented as [place] Standard Time. CONFIDENTIALTY NOTICE: This  fax  transmission is intended only for the addressee. It contains information that is legally privileged, confidential or otherwise protected from use or disclosure. If you are not the intended recipient, you are strictly prohibited from reviewing, disclosing, copying using or disseminating any of this information or taking any action in reliance on or regarding this information. If you have received this fax in error, please notify us immediately by telephone so that we can arrange for its return to [place]. Phone: [phone], Toll-Free: [phone], Fax: [phone] Page: 3 of 3 Call Id: [id number] Paging DoctorName Phone DateTime Result/ Outcome Message Type Notes notified and understanding verbalized

## 2022-08-15 ENCOUNTER — Ambulatory Visit: Payer: BC Managed Care – PPO | Admitting: Internal Medicine

## 2022-08-15 ENCOUNTER — Other Ambulatory Visit: Payer: Self-pay

## 2022-08-15 VITALS — BP 108/68 | HR 68 | Temp 98.9°F | Resp 16 | Ht 64.0 in | Wt 133.2 lb

## 2022-08-15 DIAGNOSIS — F419 Anxiety disorder, unspecified: Secondary | ICD-10-CM

## 2022-08-15 DIAGNOSIS — E039 Hypothyroidism, unspecified: Secondary | ICD-10-CM

## 2022-08-15 DIAGNOSIS — R112 Nausea with vomiting, unspecified: Secondary | ICD-10-CM | POA: Diagnosis not present

## 2022-08-15 DIAGNOSIS — R197 Diarrhea, unspecified: Secondary | ICD-10-CM | POA: Diagnosis not present

## 2022-08-15 MED ORDER — LEVOTHYROXINE SODIUM 100 MCG PO TABS: 100.0000 ug | ORAL_TABLET | Freq: Every day | ORAL | 0 refills | Status: DC

## 2022-08-15 NOTE — Progress Notes (Unsigned)
Subjective:    Patient ID: Donna Lucas, female    DOB: 1970/03/06, 52 y.o.   MRN: [id number]  DOS:  08/15/2022 Type of visit - description: ER follow-up  Went to the urgent care 08/11/2022. Chief complaint was nausea, diarrhea and anxiety.  No vomiting In the process, she was asked about chest pain and she said she had "indigestion"  which trigger a ER referral.  ER:  seen for midsternal chest pain, intermittent, stabbing, 6/10 ("indigestion" per patient).  Hemoglobin 14.0, TSH 6.4. Troponin negative Potassium 3.6, creatinine 0.6, LFTs normal except for slightly increased bilirubin. Chest x-ray negative. EKG per report unchanged from previous  When asked, denies fever but reports some night sweats. + Weight loss noted mostly because she is eating healthy and has increased her exercise.  No headaches.  Stools are loose, not watery, no blood in the stools. No abdominal pain.  Very mild GERD symptoms.   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   Wt Readings from Last 3 Encounters:  08/15/22 133 lb 4 oz (60.4 kg)  05/09/22 141 lb 6 oz (64.1 kg)  08/16/21 140 lb 4 oz (63.6 kg)      Review of Systems See above   Past Medical History:  Diagnosis Date   Abnormal CT scan, chest 02-2009   solitary nodule,f/u by pulmonology, neck CT, April 2011   Anemia    Anxiety    Elevated LFTs    after cholecystectomy    Past Surgical History:  Procedure Laterality Date   BREAST ENHANCEMENT SURGERY Bilateral 12/2017   CHOLECYSTECTOMY     ECTOPIC PREGNANCY SURGERY     ERCP     2 yrs ago after her GB removed   OVARIAN CYST REMOVAL     SUPRA-UMBILICAL HERNIA  08/2019    Current Outpatient Medications  Medication Instructions   cetirizine (ZYRTEC) 10 mg, Oral, Daily PRN   COMBIPATCH 0.05-0.14 MG/DAY 1 patch, Transdermal, 2 times weekly   escitalopram (LEXAPRO) 10 mg, Oral, Daily    Unithroid 75 mcg, Oral, Daily before breakfast   Vitamin D 1,000 Units, Oral, Daily       Objective:   Physical Exam BP 108/68   Pulse 68   Temp 98.9 F (37.2 C) (Oral)   Resp 16   Ht 5\' 4"  (1.626 m)   Wt 133 lb 4 oz (60.4 kg)   SpO2 99%   BMI 22.87 kg/m  General:   Well developed, NAD, BMI noted.  HEENT:  Normocephalic . Face symmetric, atraumatic Lungs:  CTA B Normal respiratory effort, no intercostal retractions, no accessory muscle use. Heart: RRR,  no murmur.  Abdomen:  Not distended, soft, non-tender. No rebound or rigidity.   Skin: Not pale. Not jaundice Lower extremities: no pretibial edema bilaterally  Neurologic:  alert & oriented X3.  Speech normal, gait appropriate for age and unassisted Psych--  Cognition and judgment appear intact.  Cooperative with normal attention span and concentration.  Behavior appropriate. No anxious or depressed appearing.     Assessment      Assessment Anxiety H/o Solitary Pulmonary nodule per CT chest, stable for years, last CT 2013 H/o anemia-l~ 2222   PLAN Nausea, diarrhea: Symptoms as described above, stools were somewhat loose but they are getting better. Etiology unclear, related to recent stress?  Viral infection?Marland Kitchen She reported indigestion to the ER but no SSCP, blood  work at the ER reassuring, we agreed on observation Anxiety: Has been well-controlled for a long time,  is only recently the symptoms resurfaced, possibly from work-related stress although she is increased because her TSH is elevated.  See next Hypothyroidism: Last TSH 6.4 increase levothyroxine from 75 mcg to 100 mcg.  TSH in 6 weeks (done at her community, encouraged to call after the blood work is done to find out the results)  Extensive chart review performed today in regard to urgent care and ER visits

## 2022-08-15 NOTE — Patient Instructions (Signed)
Switch levothyroxine to 100 mcg 1 tablet daily.  Please check your TSH (DX hypothyroidism) at a local lab in 6 weeks, send the results to me.  Let me know if you are not gradually back to normal

## 2022-08-16 NOTE — Assessment & Plan Note (Signed)
Nausea, diarrhea: Symptoms as described above, stools were somewhat loose but they are getting better. Etiology unclear, related to recent stress?  Viral infection?Marland Kitchen She reported indigestion to the ER but no SSCP, blood work at the ER reassuring, we agreed on observation Anxiety: Has been well-controlled for a long time,  is only recently the symptoms resurfaced, possibly from work-related stress although she is increased because her TSH is elevated.  See next Hypothyroidism: Last TSH 6.4 increase levothyroxine from 75 mcg to 100 mcg.  TSH in 6 weeks (done at her community, encouraged to call after the blood work is done to find out the results)

## 2022-09-25 ENCOUNTER — Encounter: Payer: Self-pay | Admitting: Internal Medicine

## 2022-09-25 DIAGNOSIS — E039 Hypothyroidism, unspecified: Secondary | ICD-10-CM

## 2022-09-26 ENCOUNTER — Encounter: Payer: Self-pay | Admitting: Internal Medicine

## 2022-09-26 ENCOUNTER — Telehealth: Payer: Self-pay | Admitting: Internal Medicine

## 2022-09-26 MED ORDER — LEVOTHYROXINE SODIUM 88 MCG PO TABS: 88.0000 ug | ORAL_TABLET | Freq: Every day | ORAL | 0 refills | Status: DC

## 2022-09-26 NOTE — Telephone Encounter (Signed)
Unithroid is brand of levothyroxine.  Okay to continue using it or switch to another brand, 88 mcg

## 2022-09-26 NOTE — Telephone Encounter (Signed)
Spoke w/ [facility]- issue has already been clarified.

## 2022-09-26 NOTE — Telephone Encounter (Signed)
[facility] pharmacy called stating that they needed clarification on pt's thyroid medication. Pt was previously on unithroid and wanted to ensure that the synthroid that was sent in was the correct course of medication.

## 2022-09-26 NOTE — Telephone Encounter (Signed)
Please advise 

## 2022-11-21 ENCOUNTER — Encounter: Payer: Self-pay | Admitting: Internal Medicine

## 2022-11-30 LAB — HM MAMMOGRAPHY

## 2022-12-16 LAB — TSH: TSH: 1.17 u[IU]/mL (ref 0.450–4.500)

## 2022-12-18 MED ORDER — LEVOTHYROXINE SODIUM 88 MCG PO TABS: 88.0000 ug | ORAL_TABLET | Freq: Every day | ORAL | 1 refills | Status: DC

## 2022-12-18 NOTE — Addendum Note (Signed)
Addended byConrad Enola D on: 12/18/2022 09:18 AM   Modules accepted: Orders

## 2022-12-25 ENCOUNTER — Other Ambulatory Visit: Payer: Self-pay | Admitting: Internal Medicine

## 2023-01-01 ENCOUNTER — Encounter: Payer: Self-pay | Admitting: Internal Medicine

## 2023-01-05 ENCOUNTER — Encounter: Payer: Self-pay | Admitting: Internal Medicine

## 2023-01-08 ENCOUNTER — Encounter: Payer: Self-pay | Admitting: Internal Medicine

## 2023-01-08 ENCOUNTER — Ambulatory Visit: Payer: BC Managed Care – PPO | Admitting: Internal Medicine

## 2023-01-08 VITALS — BP 116/80 | HR 69 | Temp 98.1°F | Resp 16 | Ht 64.0 in | Wt 140.1 lb

## 2023-01-08 DIAGNOSIS — R002 Palpitations: Secondary | ICD-10-CM | POA: Diagnosis not present

## 2023-01-08 DIAGNOSIS — E039 Hypothyroidism, unspecified: Secondary | ICD-10-CM

## 2023-01-08 DIAGNOSIS — Z23 Encounter for immunization: Secondary | ICD-10-CM

## 2023-01-08 LAB — TSH: TSH: 2.24 u[IU]/mL (ref 0.35–5.50)

## 2023-01-08 MED ORDER — ESCITALOPRAM OXALATE 10 MG PO TABS: 10.0000 mg | ORAL_TABLET | Freq: Every day | ORAL | 1 refills | Status: DC
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
## 2023-01-08 NOTE — Patient Instructions (Addendum)
   GO TO THE LAB : Get the blood work    See you in March.  Call sooner if needed

## 2023-01-08 NOTE — Assessment & Plan Note (Signed)
Palpitations: Chronic issue, increase over the last 2 weeks, no red flag symptoms.  Saw cardiology (for a different issue) approximately June 2024 after ER visit, echocardiogram was normal. I do not suspect a major arrhythmia on clinical grounds.  We agreed on observation, if symptoms get worse, or she has red flags, she will see her cardiologist in [facility].  See next Anxiety: Definitely increased, listening therapy provided, declined  to increase Lexapro, agreed to consider see a counselor, written therapy recommended. Hypothyroidism: Recent TSH okay, she is concerned about thyroid levels, checking labs RTC as scheduled for 04-2023

## 2023-03-07 ENCOUNTER — Other Ambulatory Visit: Payer: Self-pay | Admitting: Medical Genetics

## 2023-05-11 ENCOUNTER — Encounter: Payer: BC Managed Care – PPO | Admitting: Internal Medicine

## 2023-05-28 ENCOUNTER — Encounter: Payer: Self-pay | Admitting: Internal Medicine

## 2023-05-28 DIAGNOSIS — Z0000 Encounter for general adult medical examination without abnormal findings: Secondary | ICD-10-CM

## 2023-05-28 DIAGNOSIS — E039 Hypothyroidism, unspecified: Secondary | ICD-10-CM

## 2023-05-29 HISTORY — PX: BREAST IMPLANT REMOVAL: SUR1101

## 2023-05-29 NOTE — Addendum Note (Signed)
 Addended by: Conrad New Strawn D on: 05/29/2023 11:56 AM   Modules accepted: Orders

## 2023-05-29 NOTE — Telephone Encounter (Signed)
Orders placed and released to [facility]

## 2023-05-29 NOTE — Telephone Encounter (Signed)
 Donna Lucas, see patient's message.  It is possible we will need to fax the order for the following labs:  CMP Lipid panel CBC ==== 04/19/2023:  Vitamin D normal. TSH 1.2. Total cholesterol 230, LDL 148.

## 2023-06-04 ENCOUNTER — Telehealth: Payer: Self-pay | Admitting: Internal Medicine

## 2023-06-04 ENCOUNTER — Other Ambulatory Visit: Payer: Self-pay | Admitting: Internal Medicine

## 2023-06-04 VITALS — Ht 64.0 in

## 2023-06-04 NOTE — Progress Notes (Unsigned)
   Subjective:    Patient ID: Donna Lucas, female    DOB: January 20, 1971, 53 y.o.   MRN: [id number]  DOS:  06/04/2023 Type of visit - description:   Unable to proceed with virtual visit. Called the patient twice, unable to talk to the patient or leave a message

## 2023-06-05 ENCOUNTER — Encounter: Payer: Self-pay | Admitting: Internal Medicine

## 2023-06-05 ENCOUNTER — Telehealth (INDEPENDENT_AMBULATORY_CARE_PROVIDER_SITE_OTHER): Admitting: Internal Medicine

## 2023-06-05 VITALS — Ht 64.0 in

## 2023-06-05 DIAGNOSIS — F419 Anxiety disorder, unspecified: Secondary | ICD-10-CM | POA: Diagnosis not present

## 2023-06-05 DIAGNOSIS — R002 Palpitations: Secondary | ICD-10-CM

## 2023-06-05 DIAGNOSIS — E039 Hypothyroidism, unspecified: Secondary | ICD-10-CM

## 2023-06-05 LAB — COMPREHENSIVE METABOLIC PANEL WITH GFR
ALT: 6 IU/L (ref 0–32)
AST: 17 IU/L (ref 0–40)
Albumin: 4.3 g/dL (ref 3.8–4.9)
Alkaline Phosphatase: 37 IU/L — ABNORMAL LOW (ref 44–121)
BUN/Creatinine Ratio: 13 (ref 9–23)
BUN: 9 mg/dL (ref 6–24)
Bilirubin Total: 1.8 mg/dL — ABNORMAL HIGH (ref 0.0–1.2)
CO2: 21 mmol/L (ref 20–29)
Calcium: 9.2 mg/dL (ref 8.7–10.2)
Chloride: 104 mmol/L (ref 96–106)
Creatinine, Ser: 0.7 mg/dL (ref 0.57–1.00)
Globulin, Total: 1.8 g/dL (ref 1.5–4.5)
Glucose: 91 mg/dL (ref 70–99)
Potassium: 4 mmol/L (ref 3.5–5.2)
Sodium: 141 mmol/L (ref 134–144)
Total Protein: 6.1 g/dL (ref 6.0–8.5)
eGFR: 103 mL/min/{1.73_m2} (ref >59)

## 2023-06-05 LAB — CBC WITH DIFFERENTIAL/PLATELET
Basophils Absolute: 0 10*3/uL (ref 0.0–0.2)
Basos: 1 %
EGFR: 103
EOS (ABSOLUTE): 0 10*3/uL (ref 0.0–0.4)
Eos: 1 %
Hematocrit: 40 % (ref 34.0–46.6)
Hemoglobin: 13.5 g/dL (ref 11.1–15.9)
Immature Grans (Abs): 0 10*3/uL (ref 0.0–0.1)
Immature Granulocytes: 0 %
Lymphocytes Absolute: 2.4 10*3/uL (ref 0.7–3.1)
Lymphs: 52 %
MCH: 33.7 pg — ABNORMAL HIGH (ref 26.6–33.0)
MCHC: 33.8 g/dL (ref 31.5–35.7)
MCV: 100 fL — ABNORMAL HIGH (ref 79–97)
Monocytes Absolute: 0.4 10*3/uL (ref 0.1–0.9)
Monocytes: 8 %
Neutrophils Absolute: 1.8 10*3/uL (ref 1.4–7.0)
Neutrophils: 38 %
Platelets: 252 10*3/uL (ref 150–450)
RBC: 4.01 x10E6/uL (ref 3.77–5.28)
RDW: 12.3 % (ref 11.7–15.4)
WBC: 4.6 10*3/uL (ref 3.4–10.8)

## 2023-06-05 LAB — LIPID PANEL
Chol/HDL Ratio: 2.6 ratio (ref 0.0–4.4)
Cholesterol, Total: 187 mg/dL (ref 100–199)
HDL: 72 mg/dL (ref >39)
LDL Chol Calc (NIH): 103 mg/dL — ABNORMAL HIGH (ref 0–99)
Triglycerides: 61 mg/dL (ref 0–149)
VLDL Cholesterol Cal: 12 mg/dL (ref 5–40)

## 2023-06-05 MED ORDER — ESCITALOPRAM OXALATE 10 MG PO TABS: 10.0000 mg | ORAL_TABLET | Freq: Every day | ORAL | 1 refills | Status: DC

## 2023-06-05 MED ORDER — LEVOTHYROXINE SODIUM 88 MCG PO TABS: 88.0000 ug | ORAL_TABLET | Freq: Every day | ORAL | 1 refills | Status: DC

## 2023-06-05 NOTE — Progress Notes (Unsigned)
   Subjective:    Patient ID: Donna Lucas, female    DOB: 10-Oct-1970, 53 y.o.   MRN: [id number]  DOS:  06/05/2023 Type of visit - description: Virtual Visit via Video Note  I connected with the above patient  by a video enabled telemedicine application and verified that I am speaking with the correct person using two identifiers.   Location of patient: home  Location of provider: office  Persons participating in the virtual visit: patient, provider   I discussed the limitations of evaluation and management by telemedicine and the availability of in person appointments. The patient expressed understanding and agreed to proceed.  Routine checkup Since last office visit is doing great. Lifestyle has significantly improved. Taking multivitamins, they cause some stomach pain but denies nausea vomiting or blood in the stools.   Review of Systems See above   Past Medical History:  Diagnosis Date   Abnormal CT scan, chest 02-2009   solitary nodule,f/u by pulmonology, neck CT, April 2011   Anemia    Anxiety    Elevated LFTs    after cholecystectomy    Past Surgical History:  Procedure Laterality Date   BREAST ENHANCEMENT SURGERY Bilateral 12/2017   CHOLECYSTECTOMY     ECTOPIC PREGNANCY SURGERY     ERCP     2 yrs ago after her GB removed   OVARIAN CYST REMOVAL     SUPRA-UMBILICAL HERNIA  08/2019    Current Outpatient Medications  Medication Instructions   cetirizine (ZYRTEC) 10 mg, Daily PRN   COMBIPATCH 0.05-0.14 MG/DAY 1 patch, 2 times weekly   escitalopram (LEXAPRO) 10 mg, Oral, Daily   levothyroxine (SYNTHROID) 88 mcg, Oral, Daily before breakfast   Vitamin D 1,000 Units, Daily       Objective:   Physical Exam Ht 5\' 4"  (1.626 m)   LMP 04/18/2021   BMI 24.05 kg/m  This is a video visit, alert oriented x 3, in no distress.       Assessment     Assessment Anxiety Hypothyroidism H/o Solitary Pulmonary nodule per CT chest, stable for years, last CT 2013 H/o  anemia-l~ 2222   PLAN Blood work reviewed from 06/04/2023. CBC normal except for a slightly elevated MCV CMP normal except for slightly high bilirubin. Total cholesterol 187 (previous 221).  LDL 103 (previous 140).  Anxiety: Well-controlled on Lexapro, send a refill. Hypothyroidism: On Synthroid, send a refill.  Last TSH done at gynecology reportedly WNL. Palpitations: See last visit, since then saw cardiology, reportedly had an echo cardiogram which was normal.  No further symptoms. Increase MCV on recent CBC.  Intolerant to multivitamin tablets,  recommend B12 and vitamin D only. Preventive care: Saw gynecology recently, had a clinical exam, lifestyle is excellent, has dropped her cholesterol levels significantly. RTC 6 months.     I discussed the assessment and treatment plan with the patient. The patient was provided an opportunity to ask questions and all were answered. The patient agreed with the plan and demonstrated an understanding of the instructions.   The patient was advised to call back or seek an in-person evaluation if the symptoms worsen or if the condition fails to improve as anticipated.

## 2023-06-06 ENCOUNTER — Telehealth: Admitting: Internal Medicine

## 2023-06-06 DIAGNOSIS — E039 Hypothyroidism, unspecified: Secondary | ICD-10-CM | POA: Insufficient documentation

## 2023-06-06 NOTE — Assessment & Plan Note (Signed)
 PLAN Blood work reviewed from 06/04/2023. CBC normal except for a slightly elevated MCV CMP normal except for slightly high bilirubin. Total cholesterol 187 (previous 221).  LDL 103 (previous 140).  Anxiety: Well-controlled on Lexapro, send a refill. Hypothyroidism: On Synthroid, send a refill.  Last TSH done at gynecology reportedly WNL. Palpitations: See last visit, since then saw cardiology, reportedly had an echo cardiogram which was normal.  No further symptoms. Increase MCV on recent CBC.  Intolerant to multivitamin tablets,  recommend B12 and vitamin D only. Preventive care: Saw gynecology recently, had a clinical exam, lifestyle is excellent, has dropped her cholesterol levels significantly. RTC 6 months.

## 2023-08-29 ENCOUNTER — Encounter: Payer: Self-pay | Admitting: Internal Medicine

## 2023-08-29 DIAGNOSIS — E039 Hypothyroidism, unspecified: Secondary | ICD-10-CM

## 2023-08-29 NOTE — Telephone Encounter (Signed)
 TSH ordered.

## 2023-08-29 NOTE — Telephone Encounter (Signed)
Please place order as requested

## 2023-08-29 NOTE — Addendum Note (Signed)
 Addended by: Beaumont Austad D on: 08/29/2023 11:28 AM   Modules accepted: Orders

## 2023-09-04 ENCOUNTER — Ambulatory Visit: Payer: Self-pay | Admitting: Internal Medicine

## 2023-09-04 LAB — TSH: TSH: 1.53 u[IU]/mL (ref 0.450–4.500)

## 2023-11-05 ENCOUNTER — Encounter: Payer: Self-pay | Admitting: Internal Medicine

## 2023-11-05 DIAGNOSIS — E039 Hypothyroidism, unspecified: Secondary | ICD-10-CM

## 2023-11-05 DIAGNOSIS — Z0000 Encounter for general adult medical examination without abnormal findings: Secondary | ICD-10-CM

## 2023-11-05 DIAGNOSIS — D7589 Other specified diseases of blood and blood-forming organs: Secondary | ICD-10-CM

## 2023-11-05 DIAGNOSIS — R739 Hyperglycemia, unspecified: Secondary | ICD-10-CM

## 2023-11-05 DIAGNOSIS — Z8249 Family history of ischemic heart disease and other diseases of the circulatory system: Secondary | ICD-10-CM

## 2023-11-05 DIAGNOSIS — D509 Iron deficiency anemia, unspecified: Secondary | ICD-10-CM

## 2023-11-05 NOTE — Telephone Encounter (Signed)
 For PCP when he returns.

## 2023-11-13 NOTE — Telephone Encounter (Signed)
 Donna Aloysius BRAVO, MD to Me (Selected Message)     11/13/23 10:52 AM Please arrange for labs requested. -FLP CMP CBC TSH vitamin D A1c -B12, homocystine levels, folic acid .  Dx increased MCV -Anemia panel.  Dx history of iron deficiency. -CRP   : FH CAD

## 2023-11-13 NOTE — Telephone Encounter (Signed)
Orders placed and released to [facility]

## 2023-12-06 ENCOUNTER — Other Ambulatory Visit: Payer: Self-pay | Admitting: Internal Medicine

## 2023-12-08 LAB — B12 AND FOLATE PANEL
Folate: 19.4 ng/mL (ref >3.0)
Vitamin B-12: 775 pg/mL (ref 232–1245)

## 2023-12-08 LAB — COMPREHENSIVE METABOLIC PANEL WITH GFR
ALT: 8 IU/L (ref 0–32)
AST: 22 IU/L (ref 0–40)
Albumin: 4.3 g/dL (ref 3.8–4.9)
Alkaline Phosphatase: 35 IU/L — ABNORMAL LOW (ref 49–135)
BUN/Creatinine Ratio: 18 (ref 9–23)
BUN: 15 mg/dL (ref 6–24)
Bilirubin Total: 1.5 mg/dL — ABNORMAL HIGH (ref 0.0–1.2)
CO2: 21 mmol/L (ref 20–29)
Calcium: 8.9 mg/dL (ref 8.7–10.2)
Chloride: 105 mmol/L (ref 96–106)
Creatinine, Ser: 0.82 mg/dL (ref 0.57–1.00)
Globulin, Total: 2.2 g/dL (ref 1.5–4.5)
Glucose: 87 mg/dL (ref 70–99)
Potassium: 3.9 mmol/L (ref 3.5–5.2)
Sodium: 141 mmol/L (ref 134–144)
Total Protein: 6.5 g/dL (ref 6.0–8.5)
eGFR: 85 mL/min/1.73 (ref >59)

## 2023-12-08 LAB — CBC WITH DIFFERENTIAL/PLATELET
Basophils Absolute: 0.1 x10E3/uL (ref 0.0–0.2)
Basos: 1 %
EOS (ABSOLUTE): 0.1 x10E3/uL (ref 0.0–0.4)
Eos: 2 %
Hematocrit: 41 % (ref 34.0–46.6)
Hemoglobin: 13.5 g/dL (ref 11.1–15.9)
Immature Grans (Abs): 0 x10E3/uL (ref 0.0–0.1)
Immature Granulocytes: 0 %
Lymphocytes Absolute: 2.1 x10E3/uL (ref 0.7–3.1)
Lymphs: 50 %
MCH: 33.5 pg — ABNORMAL HIGH (ref 26.6–33.0)
MCHC: 32.9 g/dL (ref 31.5–35.7)
MCV: 102 fL — ABNORMAL HIGH (ref 79–97)
Monocytes Absolute: 0.3 x10E3/uL (ref 0.1–0.9)
Monocytes: 8 %
Neutrophils Absolute: 1.7 x10E3/uL (ref 1.4–7.0)
Neutrophils: 39 %
Platelets: 221 x10E3/uL (ref 150–450)
RBC: 4.03 x10E6/uL (ref 3.77–5.28)
RDW: 12.4 % (ref 11.7–15.4)
WBC: 4.2 x10E3/uL (ref 3.4–10.8)

## 2023-12-08 LAB — LIPID PANEL
Chol/HDL Ratio: 2.7 ratio (ref 0.0–4.4)
Cholesterol, Total: 213 mg/dL — ABNORMAL HIGH (ref 100–199)
HDL: 80 mg/dL (ref >39)
LDL Chol Calc (NIH): 126 mg/dL — ABNORMAL HIGH (ref 0–99)
Triglycerides: 37 mg/dL (ref 0–149)
VLDL Cholesterol Cal: 7 mg/dL (ref 5–40)

## 2023-12-08 LAB — TSH: TSH: 2.82 u[IU]/mL (ref 0.450–4.500)

## 2023-12-08 LAB — IRON,TIBC AND FERRITIN PANEL
Ferritin: 74 ng/mL (ref 15–150)
Iron Saturation: 34 % (ref 15–55)
Iron: 99 ug/dL (ref 27–159)
Total Iron Binding Capacity: 293 ug/dL (ref 250–450)
UIBC: 194 ug/dL (ref 131–425)

## 2023-12-08 LAB — HEMOGLOBIN A1C
Est. average glucose Bld gHb Est-mCnc: 97 mg/dL
Hgb A1c MFr Bld: 5 % (ref 4.8–5.6)

## 2023-12-08 LAB — VITAMIN D 25 HYDROXY (VIT D DEFICIENCY, FRACTURES): Vit D, 25-Hydroxy: 41.9 ng/mL (ref 30.0–100.0)

## 2023-12-08 LAB — C-REACTIVE PROTEIN: CRP: <1 mg/L (ref 0–10)

## 2023-12-08 LAB — HOMOCYSTEINE: Homocysteine: 9.5 umol/L (ref 0.0–14.5)

## 2023-12-11 ENCOUNTER — Encounter: Payer: Self-pay | Admitting: Internal Medicine

## 2023-12-11 ENCOUNTER — Ambulatory Visit (INDEPENDENT_AMBULATORY_CARE_PROVIDER_SITE_OTHER): Admitting: Internal Medicine

## 2023-12-11 VITALS — BP 126/80 | HR 69 | Temp 98.1°F | Resp 16 | Ht 64.0 in | Wt 143.0 lb

## 2023-12-11 DIAGNOSIS — Z0000 Encounter for general adult medical examination without abnormal findings: Secondary | ICD-10-CM

## 2023-12-11 NOTE — Assessment & Plan Note (Signed)
 Here for CPX -Td 2019 -Vaccines I recommend: Flu shot (will get at work), COVID booster, -Female care:  sees gynecology, [facility] 11/2022, [facility]. -CCS: + Cologuard, then had a  colonoscopy 05/22/2022, next 5 years d/t 3 polyps (per pt) - Bones: Menopause onset around 2022, on vitamin D, very active, no fracture.  DEXA should be around 2027 - Recent labs reviewed.  All okay.  LDL slightly higher than before but is still okay.  Cardiovascular risk and 10 years: 1.3% - Lifestyle: Very active, eats healthy.

## 2023-12-11 NOTE — Assessment & Plan Note (Signed)
 Here for CPX  Other issue was addressed Hyperlipidemia Slight increase in LDL cholesterol likely due to recent decrease in physical activity, plans to go back to exercise more .Low cardiovascular risk at 1.3%. - Encourage increased physical activity and dietary modifications. Increase MCV: With normal B12, observation. Anxiety Anxiety managed with escitalopram  10 mg daily. No new symptoms. Hypothyroidism Well-controlled with levothyroxine  88 mcg daily. Normal thyroid  function tests.  Recommend to check TSH in 6 months, will do it in [place] when she resides RTC 1 year.

## 2023-12-11 NOTE — Progress Notes (Signed)
 Subjective:    Patient ID: Donna Lucas, female    DOB: 06-Oct-1970, 53 y.o.   MRN: [id number]  DOS:  12/11/2023 CPX  Discussed the use of AI scribe software for clinical note transcription with the patient, who gave verbal consent to proceed.  History of Present Illness  Postoperative status following breast implant removal - Breast implants removed in April 2025 - No subsequent surgeries since removal  Covid-19 infection history - Contracted COVID-19 twice in 2025: once in April and again three weeks ago - Full recovery from both infections  Lipid abnormality - LDL cholesterol levels have increased slightly  Colorectal polyp surveillance - Colonoscopy performed last year revealed three small polyps  Thyroid  function - Currently taking thyroid  medication - Thyroid  function tests are excellent  Hormone therapy and psychiatric medication use - Currently taking Lexapro  and hormone therapy  Lifestyle and diet - Activity level 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 - Diet is less structured during the school year, often consisting of quick meals such as granola bars - Anticipates improvement in diet as the school year progresses  Substance use - Quit tobacco in 1993 - Occasionally consumes a glass of wine  Preventive health maintenance - Plans to receive a flu shot at a clinic at her school - Mammogram scheduled in two weeks  Absence of acute symptoms - No recent chest pain, respiratory difficulties, gastrointestinal symptoms, or urinary problems    Review of Systems  Other than above, a 14 point review of systems is negative    Past Medical History:  Diagnosis Date   Abnormal CT scan, chest 02-2009   solitary nodule,f/u by pulmonology, neck CT, April 2011   Anemia    Anxiety    Elevated LFTs    after cholecystectomy    Past Surgical History:  Procedure Laterality Date   BREAST  ENHANCEMENT SURGERY Bilateral 12/2017   BREAST IMPLANT REMOVAL Bilateral 05/2023   CHOLECYSTECTOMY     ECTOPIC PREGNANCY SURGERY     ERCP     2 yrs ago after her GB removed   OVARIAN CYST REMOVAL     SUPRA-UMBILICAL HERNIA  08/2019    Current Outpatient Medications  Medication Instructions   COMBIPATCH 0.05-0.14 MG/DAY 1 patch, 2 times weekly   escitalopram  (LEXAPRO ) 10 mg, Oral, Daily   levothyroxine  (SYNTHROID ) 88 mcg, Oral, Daily before breakfast   Vitamin D 1,000 Units, Daily       Objective:   Physical Exam BP 126/80   Pulse 69   Temp 98.1 F (36.7 C) (Oral)   Resp 16   Ht 5' 4 (1.626 m)   Wt 143 lb (64.9 kg)   LMP 04/18/2021   SpO2 98%   BMI 24.55 kg/m  General: Well developed, NAD, BMI noted Neck: No  thyromegaly  HEENT:  Normocephalic . Face symmetric, atraumatic Lungs:  CTA B Normal respiratory effort, no intercostal retractions, no accessory muscle use. Heart: RRR,  no murmur.  Abdomen:  Not distended, soft, non-tender. No rebound or rigidity.   Lower extremities: no pretibial edema bilaterally  Skin: Exposed areas without rash. Not pale. Not jaundice Neurologic:  alert & oriented X3.  Speech normal, gait appropriate for age and unassisted Strength symmetric and appropriate for age.  Psych: Cognition and judgment appear intact.  Cooperative with normal attention span and concentration.  Behavior appropriate. No anxious or depressed appearing.     Assessment  Assessment Anxiety Hypothyroidism H/o Solitary Pulmonary nodule per CT chest, stable for years, last CT 2013 H/o anemia-l~ 2222  FH CAD: Saw cardiology 07/2022, had an echo, no stress test needed. Assessment & Plan  Here for CPX -Td 2019 -Vaccines I recommend: Flu shot (will get at work), COVID booster, -Female care:  sees gynecology, [facility] 11/2022, [facility]. -CCS: + Cologuard, then had a  colonoscopy 05/22/2022, next 5 years d/t 3 polyps (per pt) - Bones: Menopause onset around 2022, on  vitamin D, very active, no fracture.  DEXA should be around 2027 - Recent labs reviewed.  All okay.  LDL slightly higher than before but is still okay.  Cardiovascular risk and 10 years: 1.3% - Lifestyle: Very active, eats healthy. Other issue was addressed Hyperlipidemia Slight increase in LDL cholesterol likely due to recent decrease in physical activity, plans to go back to exercise more .Low cardiovascular risk at 1.3%. - Encourage increased physical activity and dietary modifications. Increase MCV: With normal B12, observation. Anxiety Anxiety managed with escitalopram  10 mg daily. No new symptoms. Hypothyroidism Well-controlled with levothyroxine  88 mcg daily. Normal thyroid  function tests.  Recommend to check TSH in 6 months, will do it in [place] when she resides RTC 1 year.

## 2023-12-11 NOTE — Patient Instructions (Signed)
 Go to the front desk for the checkout Please make an appointment for a physical exam in 1 year   Lets check another thyroid  level in 6 months

## 2023-12-12 ENCOUNTER — Ambulatory Visit: Payer: Self-pay | Admitting: Internal Medicine

## 2023-12-16 ENCOUNTER — Other Ambulatory Visit: Payer: Self-pay | Admitting: Internal Medicine

## 2023-12-21 ENCOUNTER — Encounter: Payer: Self-pay | Admitting: Internal Medicine

## 2023-12-27 ENCOUNTER — Other Ambulatory Visit: Payer: Self-pay | Admitting: Medical Genetics

## 2023-12-27 DIAGNOSIS — Z006 Encounter for examination for normal comparison and control in clinical research program: Secondary | ICD-10-CM

## 2024-01-04 ENCOUNTER — Encounter: Payer: Self-pay | Admitting: Internal Medicine

## 2024-02-19 ENCOUNTER — Encounter: Payer: Self-pay | Admitting: Internal Medicine
